# Patient Record
Sex: Female | Born: 1975 | Race: White | Hispanic: No | Smoking: Current every day smoker
Health system: Southern US, Community
[De-identification: ages and names within clinical notes are randomized; demographics above are authoritative.]

## PROBLEM LIST (undated history)

## (undated) HISTORY — PX: APPENDECTOMY: SHX54

---

## 2006-07-17 ENCOUNTER — Emergency Department (HOSPITAL_COMMUNITY): Admission: EM | Admit: 2006-07-17 | Discharge: 2006-07-17 | Payer: Self-pay | Admitting: Emergency Medicine

## 2008-10-17 ENCOUNTER — Emergency Department (HOSPITAL_COMMUNITY): Admission: EM | Admit: 2008-10-17 | Discharge: 2008-10-17 | Payer: Self-pay | Admitting: Emergency Medicine

## 2015-04-25 ENCOUNTER — Emergency Department (HOSPITAL_COMMUNITY)
Admission: EM | Admit: 2015-04-25 | Discharge: 2015-04-26 | Disposition: A | Discharge status: Home / Self Care | Payer: Self-pay | Attending: Emergency Medicine | Admitting: Emergency Medicine

## 2015-04-25 ENCOUNTER — Encounter (HOSPITAL_COMMUNITY): Payer: Self-pay

## 2015-04-25 DIAGNOSIS — N7091 Salpingitis, unspecified: Secondary | ICD-10-CM | POA: Insufficient documentation

## 2015-04-25 DIAGNOSIS — Z79899 Other long term (current) drug therapy: Secondary | ICD-10-CM | POA: Insufficient documentation

## 2015-04-25 DIAGNOSIS — Z881 Allergy status to other antibiotic agents status: Secondary | ICD-10-CM | POA: Insufficient documentation

## 2015-04-25 DIAGNOSIS — N939 Abnormal uterine and vaginal bleeding, unspecified: Secondary | ICD-10-CM | POA: Insufficient documentation

## 2015-04-25 DIAGNOSIS — R110 Nausea: Secondary | ICD-10-CM | POA: Insufficient documentation

## 2015-04-25 DIAGNOSIS — F17200 Nicotine dependence, unspecified, uncomplicated: Secondary | ICD-10-CM | POA: Insufficient documentation

## 2015-04-25 NOTE — ED Notes (Signed)
Pt states she has been having left lower abd pain x 2 days, states she has not had a bm in 2 days as well.  Pt denies vomiting or diarrhea but states she is nauseated.

## 2015-04-26 ENCOUNTER — Emergency Department (HOSPITAL_COMMUNITY): Payer: Self-pay

## 2015-04-26 LAB — CBC WITH DIFFERENTIAL/PLATELET
BASOS PCT: 0 %
Basophils Absolute: 0 10*3/uL (ref 0.0–0.1)
EOS PCT: 0 %
Eosinophils Absolute: 0 10*3/uL (ref 0.0–0.7)
HCT: 37.5 % (ref 36.0–46.0)
HEMOGLOBIN: 12.4 g/dL (ref 12.0–15.0)
Lymphocytes Relative: 7 %
Lymphs Abs: 1.4 10*3/uL (ref 0.7–4.0)
MCH: 26.7 pg (ref 26.0–34.0)
MCHC: 33.1 g/dL (ref 30.0–36.0)
MCV: 80.8 fL (ref 78.0–100.0)
MONO ABS: 1 10*3/uL (ref 0.1–1.0)
MONOS PCT: 5 %
NEUTROS ABS: 19 10*3/uL — AB (ref 1.7–7.7)
Neutrophils Relative %: 88 %
Platelets: 229 10*3/uL (ref 150–400)
RBC: 4.64 MIL/uL (ref 3.87–5.11)
RDW: 15.9 % — ABNORMAL HIGH (ref 11.5–15.5)
WBC: 21.4 10*3/uL — ABNORMAL HIGH (ref 4.0–10.5)

## 2015-04-26 LAB — COMPREHENSIVE METABOLIC PANEL
ALK PHOS: 122 U/L (ref 38–126)
ALT: 15 U/L (ref 14–54)
AST: 13 U/L — AB (ref 15–41)
Albumin: 3.6 g/dL (ref 3.5–5.0)
Anion gap: 9 (ref 5–15)
BILIRUBIN TOTAL: 1 mg/dL (ref 0.3–1.2)
BUN: 10 mg/dL (ref 6–20)
CO2: 25 mmol/L (ref 22–32)
CREATININE: 0.65 mg/dL (ref 0.44–1.00)
Calcium: 9 mg/dL (ref 8.9–10.3)
Chloride: 102 mmol/L (ref 101–111)
GFR calc Af Amer: >60 mL/min (ref >60)
Glucose, Bld: 140 mg/dL — ABNORMAL HIGH (ref 65–99)
Potassium: 3.3 mmol/L — ABNORMAL LOW (ref 3.5–5.1)
Sodium: 136 mmol/L (ref 135–145)
TOTAL PROTEIN: 8.3 g/dL — AB (ref 6.5–8.1)

## 2015-04-26 LAB — URINALYSIS, ROUTINE W REFLEX MICROSCOPIC
Glucose, UA: NEGATIVE mg/dL
Ketones, ur: 15 mg/dL — AB
Nitrite: NEGATIVE
PROTEIN: 100 mg/dL — AB
SPECIFIC GRAVITY, URINE: 1.02 (ref 1.005–1.030)
pH: 5.5 (ref 5.0–8.0)

## 2015-04-26 LAB — URINE MICROSCOPIC-ADD ON

## 2015-04-26 LAB — WET PREP, GENITAL
Sperm: NONE SEEN
Trich, Wet Prep: NONE SEEN
Yeast Wet Prep HPF POC: NONE SEEN

## 2015-04-26 LAB — PREGNANCY, URINE: PREG TEST UR: NEGATIVE

## 2015-04-26 MED ORDER — IOHEXOL 300 MG/ML  SOLN
100.0000 mL | Freq: Once | INTRAMUSCULAR | Status: AC | PRN
  Administered 2015-04-26: 100 mL via INTRAVENOUS

## 2015-04-26 MED ORDER — STERILE WATER FOR INJECTION IJ SOLN
INTRAMUSCULAR | Status: AC
  Filled 2015-04-26: qty 10

## 2015-04-26 MED ORDER — DOXYCYCLINE HYCLATE 100 MG PO CAPS: ORAL_CAPSULE | ORAL | Status: DC

## 2015-04-26 MED ORDER — ONDANSETRON HCL 4 MG/2ML IJ SOLN
4.0000 mg | Freq: Once | INTRAMUSCULAR | Status: AC
  Administered 2015-04-26: 4 mg via INTRAVENOUS
  Filled 2015-04-26: qty 2

## 2015-04-26 MED ORDER — DIPHENHYDRAMINE HCL 50 MG/ML IJ SOLN
50.0000 mg | Freq: Once | INTRAMUSCULAR | Status: AC
  Administered 2015-04-26: 50 mg via INTRAVENOUS
  Filled 2015-04-26: qty 1

## 2015-04-26 MED ORDER — DOXYCYCLINE HYCLATE 100 MG PO TABS
100.0000 mg | ORAL_TABLET | Freq: Once | ORAL | Status: AC
  Administered 2015-04-26: 100 mg via ORAL
  Filled 2015-04-26: qty 1

## 2015-04-26 MED ORDER — MORPHINE SULFATE (PF) 4 MG/ML IV SOLN
4.0000 mg | Freq: Once | INTRAVENOUS | Status: AC
  Administered 2015-04-26: 4 mg via INTRAVENOUS
  Filled 2015-04-26: qty 1

## 2015-04-26 MED ORDER — IOHEXOL 300 MG/ML  SOLN
50.0000 mL | Freq: Once | INTRAMUSCULAR | Status: AC | PRN
  Administered 2015-04-26: 50 mL via ORAL

## 2015-04-26 MED ORDER — CEFTRIAXONE SODIUM 250 MG IJ SOLR
250.0000 mg | Freq: Once | INTRAMUSCULAR | Status: AC
  Administered 2015-04-26: 250 mg via INTRAMUSCULAR
  Filled 2015-04-26: qty 250

## 2015-04-26 MED ORDER — METHYLPREDNISOLONE SODIUM SUCC 125 MG IJ SOLR
125.0000 mg | Freq: Once | INTRAMUSCULAR | Status: AC
  Administered 2015-04-26: 125 mg via INTRAVENOUS
  Filled 2015-04-26: qty 2

## 2015-04-26 MED ORDER — OXYCODONE-ACETAMINOPHEN 5-325 MG PO TABS: 1.0000 | ORAL_TABLET | Freq: Three times a day (TID) | ORAL | Status: DC | PRN

## 2015-04-26 MED ORDER — METRONIDAZOLE 500 MG PO TABS: ORAL_TABLET | ORAL | Status: DC

## 2015-04-26 NOTE — ED Provider Notes (Signed)
CSN: [id number]     Arrival date & time 04/25/15  2345 History   First MD Initiated Contact with Patient 04/25/15 2356     Chief Complaint  Patient presents with  . Abdominal Pain     (Consider location/radiation/quality/duration/timing/severity/associated sxs/prior Treatment) The history is provided by the patient.   Felicia Livingston is a 40 y.o. female presenting with sudden onset left lower quadrant abdominal pain starting 24 hours ago, described as constant, sharp and worsening.  She reports constipation with last bm occuring 3 days ago, she regularly has bid bm's.  States had a very stressful experience 2 days ago as she was a witness in a court case which she suspects is the source her pain and constipation.  She denies fevers or chills, but has had nausea without emesis.  Her pain is worsened with movement and palpation. She denies dysuria, hematuria, vaginal discharge and back pain.  She has found no alleviators, took ibuprofen without relief.  She reports having an asymptomatic left ovarian cyst several years ago. LMP 2/27.     History reviewed. No pertinent past medical history. Past Surgical History  Procedure Laterality Date  . Appendectomy     No family history on file. Social History  Substance Use Topics  . Smoking status: Current Every Day Smoker  . Smokeless tobacco: None  . Alcohol Use: No   OB History    No data available     Review of Systems  Constitutional: Negative for fever and chills.  HENT: Negative for congestion and sore throat.   Eyes: Negative.   Respiratory: Negative for chest tightness and shortness of breath.   Cardiovascular: Negative for chest pain.  Gastrointestinal: Positive for nausea, abdominal pain and constipation. Negative for vomiting.  Genitourinary: Positive for vaginal bleeding. Negative for dysuria and vaginal discharge.       Currently on menses  Musculoskeletal: Negative for joint swelling, arthralgias and neck pain.  Skin:  Negative.  Negative for rash and wound.  Neurological: Negative for dizziness, weakness, light-headedness, numbness and headaches.  Psychiatric/Behavioral: Negative.       Allergies  Lidocaine; Sulfa antibiotics; and Iohexol  Home Medications   Prior to Admission medications   Medication Sig Start Date End Date Taking? Authorizing Provider  ibuprofen (ADVIL,MOTRIN) 200 MG tablet Take 200 mg by mouth every 6 (six) hours as needed.   Yes Historical Provider, MD  doxycycline (VIBRAMYCIN) 100 MG capsule One po bid x 14 days 04/26/15   Zadie Rhine, MD  metroNIDAZOLE (FLAGYL) 500 MG tablet One po bid x 14 days  DO NOT DRINK ALCOHOL WITH THIS MEDICATION 04/26/15   Zadie Rhine, MD  oxyCODONE-acetaminophen (PERCOCET/ROXICET) 5-325 MG tablet Take 1 tablet by mouth every 8 (eight) hours as needed for severe pain. 04/26/15   Zadie Rhine, MD   BP 116/69 mmHg  Pulse 89  Temp(Src) 98.1 F (36.7 C)  Resp 18  Ht  (1.727 m)  Wt 90.719 kg  BMI 30.42 kg/m2  SpO2 96%  LMP 04/23/2015 Physical Exam  Constitutional: She appears well-developed and well-nourished. She appears distressed.  Appears uncomfortable  HENT:  Head: Normocephalic and atraumatic.  Eyes: Conjunctivae are normal.  Neck: Normal range of motion.  Cardiovascular: Regular rhythm, normal heart sounds and intact distal pulses.  Tachycardia present.   Pulmonary/Chest: Effort normal and breath sounds normal. She has no wheezes.  Abdominal: Soft. Bowel sounds are normal. There is tenderness in the left lower quadrant. There is guarding. There is no rigidity  and no CVA tenderness.  Referred pain to llq.  Genitourinary: Uterus is not enlarged and not tender. Cervix exhibits no motion tenderness. Right adnexum displays no mass, no tenderness and no fullness. Left adnexum displays no mass, no tenderness and no fullness. No tenderness in the vagina. No vaginal discharge found.  Trace blood in vagina.  Musculoskeletal: Normal  range of motion.  Neurological: She is alert.  Skin: Skin is warm and dry.  Psychiatric: She has a normal mood and affect.  Nursing note and vitals reviewed.   ED Course  Procedures (including critical care time) Labs Review Labs Reviewed  WET PREP, GENITAL - Abnormal; Notable for the following:    Clue Cells Wet Prep HPF POC PRESENT (*)    WBC, Wet Prep HPF POC FEW (*)    All other components within normal limits  CBC WITH DIFFERENTIAL/PLATELET - Abnormal; Notable for the following:    WBC 21.4 (*)    RDW 15.9 (*)    Neutro Abs 19.0 (*)    All other components within normal limits  COMPREHENSIVE METABOLIC PANEL - Abnormal; Notable for the following:    Potassium 3.3 (*)    Glucose, Bld 140 (*)    Total Protein 8.3 (*)    AST 13 (*)    All other components within normal limits  URINALYSIS, ROUTINE W REFLEX MICROSCOPIC (NOT AT [facility]) - Abnormal; Notable for the following:    Color, Urine AMBER (*)    APPearance HAZY (*)    Hgb urine dipstick LARGE (*)    Bilirubin Urine MODERATE (*)    Ketones, ur 15 (*)    Protein, ur 100 (*)    Leukocytes, UA TRACE (*)    All other components within normal limits  URINE MICROSCOPIC-ADD ON - Abnormal; Notable for the following:    Squamous Epithelial / LPF 6-30 (*)    Bacteria, UA MANY (*)    Casts RED CELL CAST (*)    All other components within normal limits  PREGNANCY, URINE  GC/CHLAMYDIA PROBE AMP ([place]) NOT AT [facility]    Imaging Review Ct Abdomen Pelvis W Contrast  04/26/2015  CLINICAL DATA:  41 year old female with lower abdominal pain x2 days. EXAM: CT ABDOMEN AND PELVIS WITH CONTRAST TECHNIQUE: Multidetector CT imaging of the abdomen and pelvis was performed using the standard protocol following bolus administration of intravenous contrast. CONTRAST:  50mL OMNIPAQUE IOHEXOL 300 MG/ML SOLN, OMNIPAQUE IOHEXOL 300 MG/ML SOLN COMPARISON:  None. FINDINGS: The visualized lung bases are clear. No intra-abdominal free air. Trace  free fluid within the pelvis. The liver, gallbladder, pancreas, spleen, adrenal glands, kidneys, visualized ureters, and urinary bladder appear unremarkable. The uterus is anteverted and grossly unremarkable. Dilated and fluid-filled tubular structure in the region of the left adnexal most compatible with hydrosalpinx. There is mild haziness of the walls of the dilated fallopian tubes with mild haziness of the surrounding fat concerning for superimposed infection. Clinical correlation is recommended. The right ovary is grossly unremarkable. There is moderate stool throughout the colon. No evidence of bowel obstruction or inflammation. Appendectomy. The abdominal aorta and IVC appear unremarkable. No portal venous gas identified. There is no adenopathy. The abdominal wall soft tissues appear unremarkable. The osseous structures are intact. IMPRESSION: Dilated fluid-filled left fallopian tube with possible superimposed infection may represent salpingitis. Correlation with clinical exam is recommended to evaluate for pelvic inflammatory disease. No other acute intra-abdominal or pelvic pathology identified. Electronically Signed   By: Ceasar Mons.D.  On: 04/26/2015 03:20   I have personally reviewed and evaluated these images and lab results as part of my medical decision-making.   EKG Interpretation None      MDM   Final diagnoses:  Salpingitis    Pt with llq pain, no adnexal tenderness. Pt wit perceived constipation and LLQ pain, wbc count of 21.4.  CT scan pending.  Dr. Bebe Shaggy to dispo patient once CT results.    Burgess Amor, PA-C 04/26/15 1206  Zadie Rhine, MD 04/27/15 [phone]

## 2015-04-26 NOTE — ED Provider Notes (Signed)
I assumed care at signout to f/u on CT imaging Pt required pre-treatment with benadryl/solumedrol due to previous itching after IV contrast No complications with IV contrast tonight CT reveals probable salpingitis Pt reports distant h/o STD but none recently Given CT findings, will treat as PID for 2 weeks Advised to avoid all sexual activity Advised GC/Chlamydia results still pending but will empirically treat I Discussed this patient in private Pt appears improved She is otherwise stable for d/c home   Zadie Rhine, MD 04/26/15 [phone]

## 2015-04-26 NOTE — Discharge Instructions (Signed)
°  HOME CARE  Take over-the-counter and prescription medicines only as told by your doctor.  If you were prescribed an antibiotic medicine, take it as told by your doctor. Do not stop taking it even if you start to feel better.  Do not have sex until treatment is done or as told by your doctor.  Keep all follow-up visits as told by your doctor. This is important.  Your doctor may test you for infection again 3 months after you are treated. GET HELP IF:  You have more fluid (discharge) coming from your vagina or fluid that is not normal.  Your pain does not improve.  You throw up (vomit).  You have a fever.  You cannot take your medicines.  Your partner has a sexually transmitted disease (STD).  You have pain when you pee (urinate). GET HELP RIGHT AWAY IF:  You have more belly (abdominal) or lower belly pain.  You have chills.  You are not better after 72 hours.   This information is not intended to replace advice given to you by your health care provider. Make sure you discuss any questions you have with your health care provider.   Document Released: 05/09/2008 Document Revised: 11/01/2014 Document Reviewed: 03/20/2014 Elsevier Interactive Patient Education [employer]

## 2015-04-27 LAB — GC/CHLAMYDIA PROBE AMP (~~LOC~~) NOT AT ARMC
Chlamydia: NEGATIVE
Neisseria Gonorrhea: NEGATIVE

## 2015-09-28 ENCOUNTER — Emergency Department (HOSPITAL_COMMUNITY)
Admission: EM | Admit: 2015-09-28 | Discharge: 2015-09-28 | Disposition: A | Discharge status: Home / Self Care | Payer: Self-pay | Attending: Emergency Medicine | Admitting: Emergency Medicine

## 2015-09-28 ENCOUNTER — Encounter (HOSPITAL_COMMUNITY): Payer: Self-pay | Admitting: Emergency Medicine

## 2015-09-28 DIAGNOSIS — L02411 Cutaneous abscess of right axilla: Secondary | ICD-10-CM | POA: Insufficient documentation

## 2015-09-28 DIAGNOSIS — F17200 Nicotine dependence, unspecified, uncomplicated: Secondary | ICD-10-CM | POA: Insufficient documentation

## 2015-09-28 LAB — CBC WITH DIFFERENTIAL/PLATELET
Basophils Absolute: 0 10*3/uL (ref 0.0–0.1)
Basophils Relative: 0 %
EOS ABS: 0.1 10*3/uL (ref 0.0–0.7)
EOS PCT: 0 %
HCT: 40.1 % (ref 36.0–46.0)
Hemoglobin: 12.7 g/dL (ref 12.0–15.0)
LYMPHS ABS: 1.4 10*3/uL (ref 0.7–4.0)
Lymphocytes Relative: 11 %
MCH: 26.1 pg (ref 26.0–34.0)
MCHC: 31.7 g/dL (ref 30.0–36.0)
MCV: 82.5 fL (ref 78.0–100.0)
MONOS PCT: 5 %
Monocytes Absolute: 0.6 10*3/uL (ref 0.1–1.0)
Neutro Abs: 11 10*3/uL — ABNORMAL HIGH (ref 1.7–7.7)
Neutrophils Relative %: 84 %
PLATELETS: 255 10*3/uL (ref 150–400)
RBC: 4.86 MIL/uL (ref 3.87–5.11)
RDW: 15.8 % — ABNORMAL HIGH (ref 11.5–15.5)
WBC: 13.1 10*3/uL — AB (ref 4.0–10.5)

## 2015-09-28 LAB — BASIC METABOLIC PANEL
Anion gap: 9 (ref 5–15)
BUN: 10 mg/dL (ref 6–20)
CALCIUM: 9 mg/dL (ref 8.9–10.3)
CO2: 24 mmol/L (ref 22–32)
CREATININE: 0.6 mg/dL (ref 0.44–1.00)
Chloride: 100 mmol/L — ABNORMAL LOW (ref 101–111)
GFR calc Af Amer: >60 mL/min (ref >60)
Glucose, Bld: 135 mg/dL — ABNORMAL HIGH (ref 65–99)
Potassium: 3.8 mmol/L (ref 3.5–5.1)
SODIUM: 133 mmol/L — AB (ref 135–145)

## 2015-09-28 MED ORDER — HYDROCODONE-ACETAMINOPHEN 5-325 MG PO TABS: 1.0000 | ORAL_TABLET | ORAL | 0 refills | Status: DC | PRN

## 2015-09-28 MED ORDER — DOXYCYCLINE HYCLATE 100 MG PO CAPS: 100.0000 mg | ORAL_CAPSULE | Freq: Two times a day (BID) | ORAL | 0 refills | Status: DC

## 2015-09-28 MED ORDER — DOXYCYCLINE HYCLATE 100 MG PO TABS
100.0000 mg | ORAL_TABLET | Freq: Once | ORAL | Status: AC
  Administered 2015-09-28: 100 mg via ORAL
  Filled 2015-09-28: qty 1

## 2015-09-28 MED ORDER — OXYCODONE-ACETAMINOPHEN 5-325 MG PO TABS
1.0000 | ORAL_TABLET | Freq: Once | ORAL | Status: AC
  Administered 2015-09-28: 1 via ORAL
  Filled 2015-09-28: qty 1

## 2015-09-28 NOTE — ED Provider Notes (Signed)
[facility]-EMERGENCY DEPT Provider Note   CSN: [id number] Arrival date & time: 09/28/15  1453  First Provider Contact:  First MD Initiated Contact with Patient 09/28/15 1909        History   Chief Complaint Chief Complaint  Patient presents with  . Abscess    HPI Felicia Livingston is a 40 y.o. female who presents to the ED with an abscess to the right axilla that started 4 days ago. She reports that she has them often but they usually drain on their own after she applied warm compresses. This one has gotten larger but will not drain.   The history is provided by the patient. No language interpreter was used.  Abscess  Location:  Shoulder/arm Shoulder/arm abscess location:  R axilla Associated symptoms: no fever, no nausea and no vomiting     History reviewed. No pertinent past medical history.  There are no active problems to display for this patient.   Past Surgical History:  Procedure Laterality Date  . APPENDECTOMY      OB History    No data available       Home Medications    Prior to Admission medications   Medication Sig Start Date End Date Taking? Authorizing Provider  doxycycline (VIBRAMYCIN) 100 MG capsule Take 1 capsule (100 mg total) by mouth 2 (two) times daily. 09/28/15   Shaquinta Peruski Orlene Och, NP  HYDROcodone-acetaminophen (NORCO/VICODIN) 5-325 MG tablet Take 1 tablet by mouth every 4 (four) hours as needed. 09/28/15   Starr Engel Orlene Och, NP  ibuprofen (ADVIL,MOTRIN) 200 MG tablet Take 200 mg by mouth every 6 (six) hours as needed.    Historical Provider, MD  metroNIDAZOLE (FLAGYL) 500 MG tablet One po bid x 14 days  DO NOT DRINK ALCOHOL WITH THIS MEDICATION 04/26/15   Zadie Rhine, MD  oxyCODONE-acetaminophen (PERCOCET/ROXICET) 5-325 MG tablet Take 1 tablet by mouth every 8 (eight) hours as needed for severe pain. 04/26/15   Zadie Rhine, MD    Family History History reviewed. No pertinent family history.  Social History Social History  Substance Use Topics  .  Smoking status: Current Every Day Smoker  . Smokeless tobacco: Not on file  . Alcohol use No     Allergies   Lidocaine; Sulfa antibiotics; and Iohexol   Review of Systems Review of Systems  Constitutional: Negative for fever.  Gastrointestinal: Negative for nausea and vomiting.  Skin: Positive for wound.  all other systems negataive   Physical Exam Updated Vital Signs BP 129/88 (BP Location: Left Arm)   Pulse 98   Temp 98.6 F (37 C) (Oral)   Resp 18   Ht 5\' 9"  (1.753 m)   Wt 90.7 kg   LMP 09/13/2015   SpO2 98%   BMI 29.53 kg/m   Physical Exam  Constitutional: She is oriented to person, place, and time. She appears well-developed and well-nourished. No distress.  HENT:  Head: Normocephalic and atraumatic.  Eyes: EOM are normal.  Neck: Neck supple.  Cardiovascular: Normal rate.   Pulmonary/Chest: Effort normal.  Abdominal: Soft. There is no tenderness.  Musculoskeletal: Normal range of motion. Tenderness: right axilla.  Neurological: She is alert and oriented to person, place, and time. No cranial nerve deficit.  Skin:  Right axilla with large raised tender area with erythema. The area is fluctuant.  Nursing note and vitals reviewed.    ED Treatments / Results  Labs (all labs ordered are listed, but only abnormal results are displayed) Labs Reviewed  CBC WITH  DIFFERENTIAL/PLATELET - Abnormal; Notable for the following:       Result Value   WBC 13.1 (*)    RDW 15.8 (*)    Neutro Abs 11.0 (*)    All other components within normal limits  BASIC METABOLIC PANEL - Abnormal; Notable for the following:    Sodium 133 (*)    Chloride 100 (*)    Glucose, Bld 135 (*)    All other components within normal limits     Radiology No results found.  Procedures .Marland KitchenIncision and Drainage Date/Time: 09/28/2015 7:15 PM Performed by: Janne Napoleon Authorized by: Janne Napoleon   Consent:    Consent obtained:  Verbal   Consent given by:  Patient   Risks discussed:   Incomplete drainage and pain   Alternatives discussed:  No treatment Location:    Type:  Abscess (right axilla)   Size:  5 cm   Location:  Upper extremity   Upper extremity location:  Arm   Arm location: right axilla. Pre-procedure details:    Skin preparation:  Betadine Anesthesia (see MAR for exact dosages):    Anesthesia method:  None (patient is allergic to lidocaine) Procedure type:    Complexity:  Complex Procedure details:    Incision types:  Single straight   Incision depth:  Subcutaneous   Scalpel blade:  11   Drainage:  Purulent   Drainage amount:  Copious   Wound treatment:  Wound left open   Packing materials:  None Post-procedure details:    Patient tolerance of procedure:  Tolerated well, no immediate complications Comments:     Unable to use lidocaine due to patient allergy. Patient agreed to procedure without anesthesia. She tolerated the procedure and was given Percocet for pain while in the department.  Patient was unable to tolerate probing the wound. She is up to date on tetanus.    (including critical care time)  Medications Ordered in ED Medications  doxycycline (VIBRA-TABS) tablet 100 mg (100 mg Oral Given 09/28/15 1934)  oxyCODONE-acetaminophen (PERCOCET/ROXICET) 5-325 MG per tablet 1 tablet (1 tablet Oral Given 09/28/15 1934)     Initial Impression / Assessment and Plan / ED Course  I have reviewed the triage vital signs and the nursing notes.  Pertinent lab results that were available during my care of the patient were reviewed by me and considered in my medical decision making (see chart for details).  Clinical Course  Discussed with the patient and all questioned fully answered. She will return if any problems arise.    Final Clinical Impressions(s) / ED Diagnoses  40 y.o. female with large abscess to the right axilla stable for d/c without fever red streaking and does not appear toxic. Will start antibiotics and she will apply warm wet  compresses to the area. She will f/u with her PCP or return here for worsening symptoms.   Final diagnoses:  Abscess of right axilla    New Prescriptions Discharge Medication List as of 09/28/2015  7:33 PM    START taking these medications   Details  HYDROcodone-acetaminophen (NORCO/VICODIN) 5-325 MG tablet Take 1 tablet by mouth every 4 (four) hours as needed., Starting Fri 09/28/2015, Print         [place], NP 09/28/15 2234    Maia Plan, MD 09/29/15 2217

## 2015-09-28 NOTE — ED Notes (Signed)
Pt waiting in lobby, respirations are even and unlabored, NAD noted. 

## 2015-09-28 NOTE — ED Triage Notes (Signed)
Pt states she has an abscess under right axilla.

## 2015-09-28 NOTE — ED Notes (Signed)
Pt alert & oriented x4, stable gait. Patient given discharge instructions, paperwork & prescription(s). Patient  instructed to stop at the registration desk to finish any additional paperwork. Patient verbalized understanding. Pt left department w/ no further questions. 

## 2017-03-21 IMAGING — CT CT ABD-PELV W/ CM
2 of 5 series · 16 of 46 positions shown, 18 images · IV contrast (Omnipaque 300)
Comparison: None.

CLINICAL DATA: 39-year-old female with lower abdominal pain x2
days.

EXAM:
CT ABDOMEN AND PELVIS WITH CONTRAST
TECHNIQUE: Multidetector CT imaging of the abdomen and pelvis was performed
using the standard protocol following bolus administration of
intravenous contrast.
CONTRAST:  50mL OMNIPAQUE IOHEXOL 300 MG/ML SOLN, 100mL OMNIPAQUE
IOHEXOL 300 MG/ML SOLN

[Series 2: abd_pel_with 5.0 b40f · axial · 0.79mm/px · z∈[-492,-47]mm · 13 of 101 slices shown, 15 images]
[im 6/101  soft-tissue]
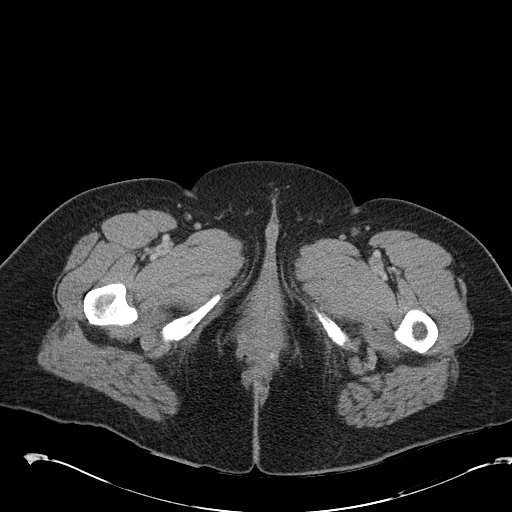
[im 6/101  bone]
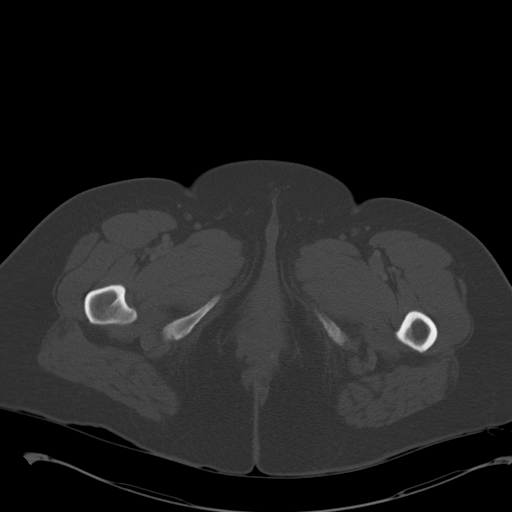
[im 12/101  soft-tissue]
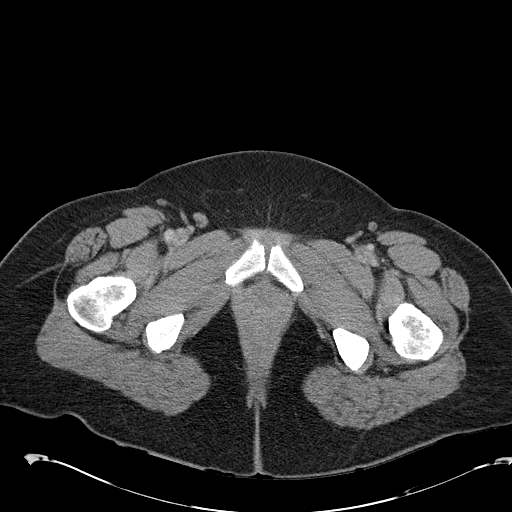
[im 24/101  soft-tissue]
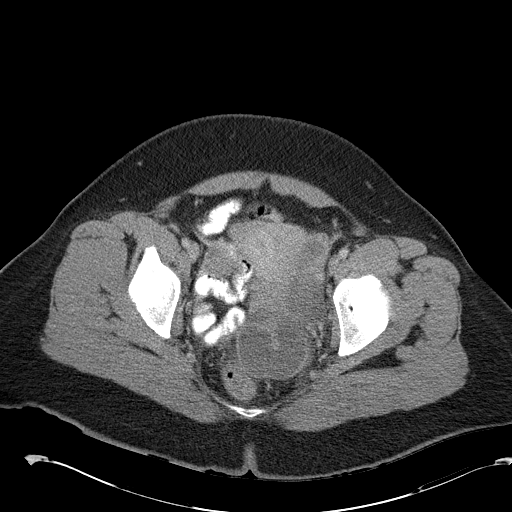
[im 30/101  soft-tissue]
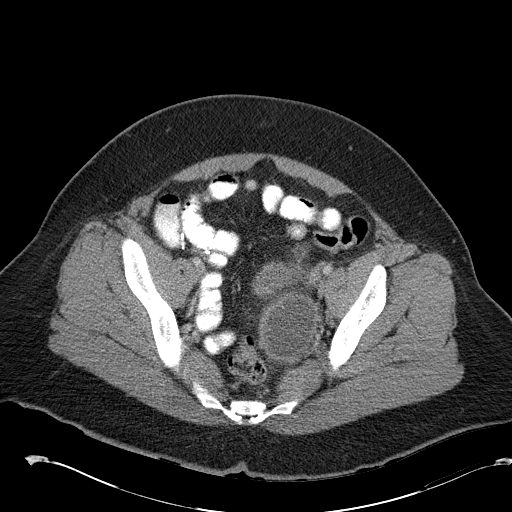
[im 36/101  soft-tissue]
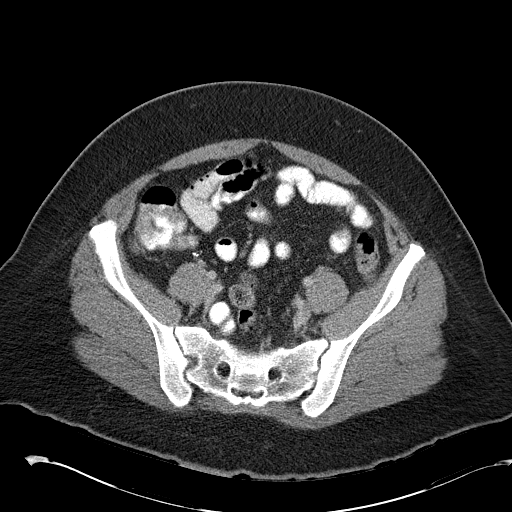
[im 42/101  soft-tissue]
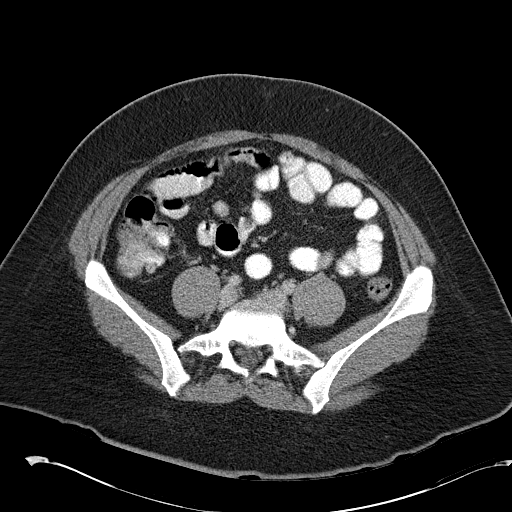
[im 53/101  soft-tissue]
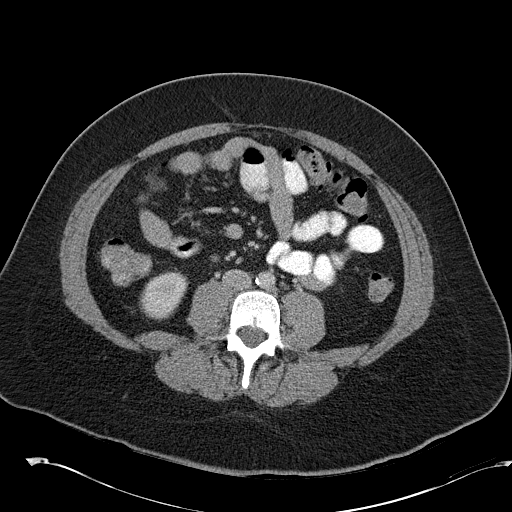
[im 59/101  soft-tissue]
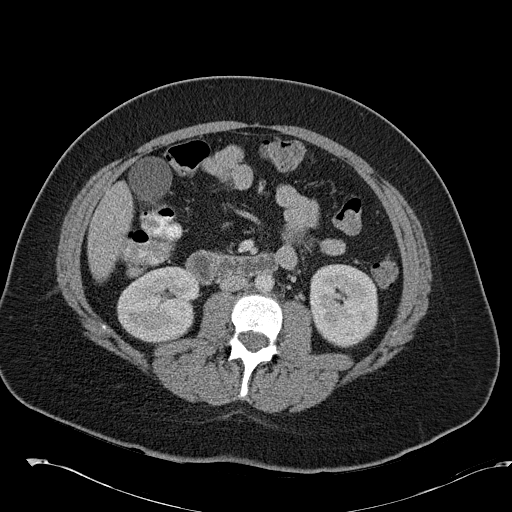
[im 65/101  soft-tissue]
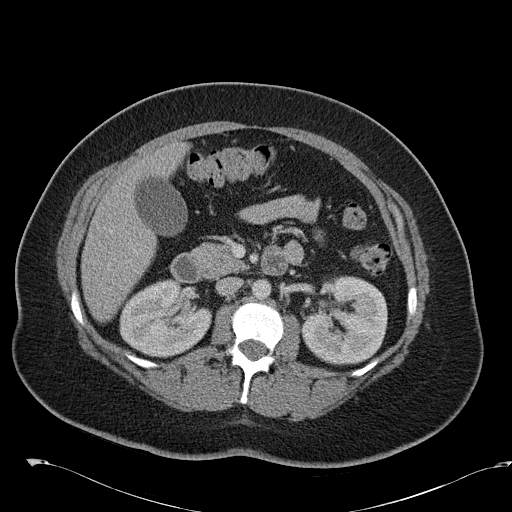
[im 65/101  bone]
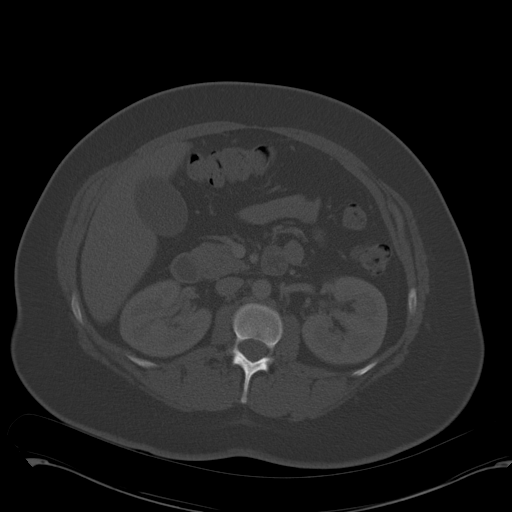
[im 71/101  soft-tissue]
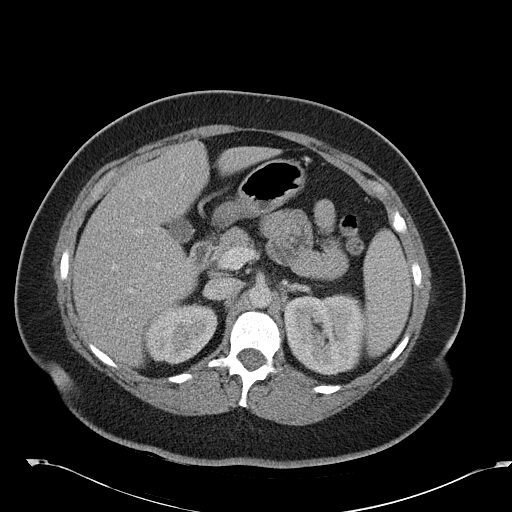
[im 77/101  soft-tissue]
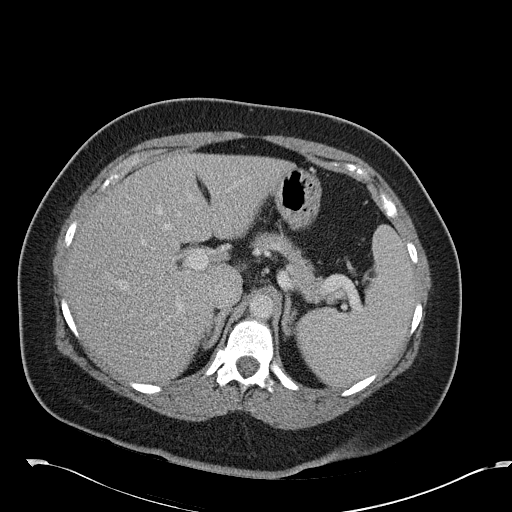
[im 89/101  soft-tissue]
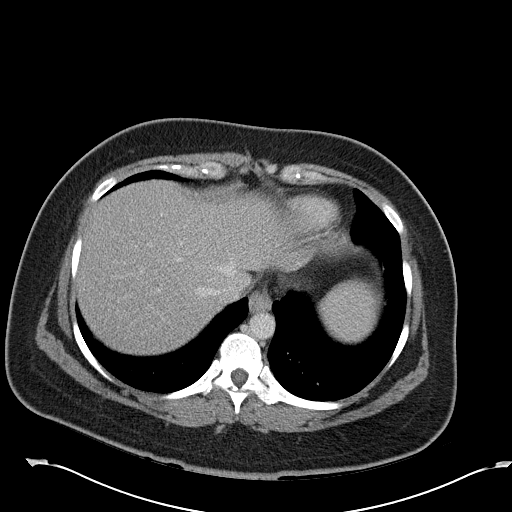
[im 95/101  soft-tissue]
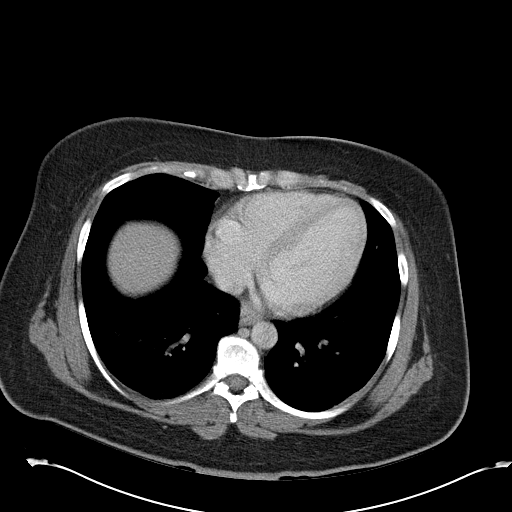

[Series 3: abd_pel_with 3.0 spo cor · coronal · 0.85mm/px · 3 of 96 slices shown]
[im 32/96  soft-tissue]
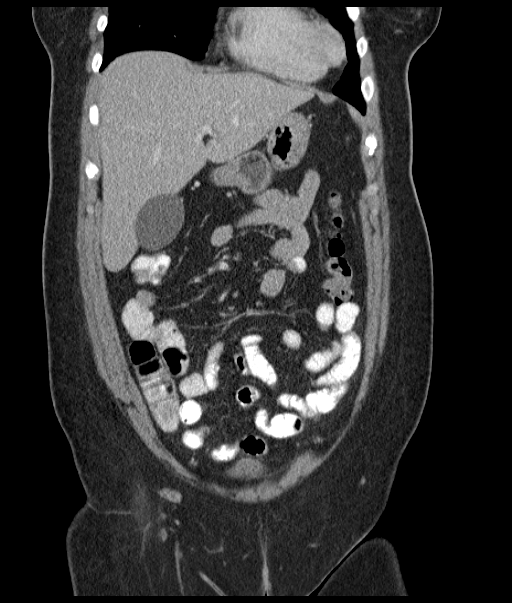
[im 43/96  soft-tissue]
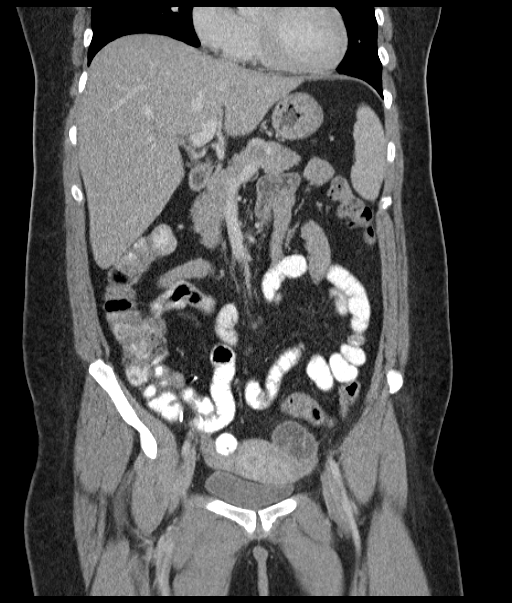
[im 53/96  soft-tissue]
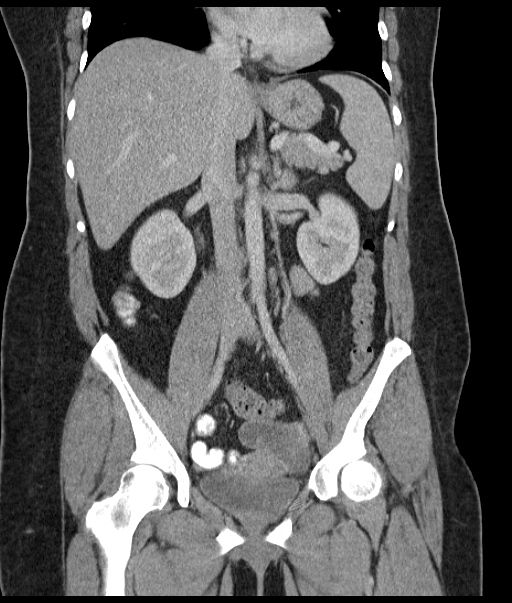

[16 of 46 positions shown; findings below may reference images not displayed]

FINDINGS: The visualized lung bases are clear. No intra-abdominal free air.
Trace free fluid within the pelvis.

The liver, gallbladder, pancreas, spleen, adrenal glands, kidneys,
visualized ureters, and urinary bladder appear unremarkable. The
uterus is anteverted and grossly unremarkable. Dilated and
fluid-filled tubular structure in the region of the left adnexal
most compatible with hydrosalpinx. There is mild haziness of the
walls of the dilated fallopian tubes with mild haziness of the
surrounding fat concerning for superimposed infection. Clinical
correlation is recommended. The right ovary is grossly unremarkable.

There is moderate stool throughout the colon. No evidence of bowel
obstruction or inflammation. Appendectomy.

The abdominal aorta and IVC appear unremarkable. No portal venous
gas identified. There is no adenopathy. The abdominal wall soft
tissues appear unremarkable. The osseous structures are intact.
IMPRESSION: Dilated fluid-filled left fallopian tube with possible superimposed
infection may represent salpingitis. Correlation with clinical exam
is recommended to evaluate for pelvic inflammatory disease. No other
acute intra-abdominal or pelvic pathology identified.

## 2019-03-07 ENCOUNTER — Other Ambulatory Visit: Payer: Self-pay

## 2019-03-07 ENCOUNTER — Ambulatory Visit
Admission: EM | Admit: 2019-03-07 | Discharge: 2019-03-07 | Disposition: A | Discharge status: Home / Self Care | Payer: Self-pay | Attending: Emergency Medicine | Admitting: Emergency Medicine

## 2019-03-07 DIAGNOSIS — N3001 Acute cystitis with hematuria: Secondary | ICD-10-CM

## 2019-03-07 LAB — POCT URINALYSIS DIP (MANUAL ENTRY)
Bilirubin, UA: NEGATIVE
Glucose, UA: 250 mg/dL — AB
Nitrite, UA: POSITIVE — AB
Protein Ur, POC: >=300 mg/dL — AB
Spec Grav, UA: 1.01 (ref 1.010–1.025)
Urobilinogen, UA: 2 E.U./dL — AB
pH, UA: 5 (ref 5.0–8.0)

## 2019-03-07 LAB — POCT URINE PREGNANCY: Preg Test, Ur: NEGATIVE

## 2019-03-07 MED ORDER — PHENAZOPYRIDINE HCL 100 MG PO TABS: 100.0000 mg | ORAL_TABLET | Freq: Three times a day (TID) | ORAL | 0 refills | Status: DC | PRN

## 2019-03-07 MED ORDER — CEPHALEXIN 250 MG PO CAPS: 250.0000 mg | ORAL_CAPSULE | Freq: Four times a day (QID) | ORAL | 0 refills | Status: DC

## 2019-03-07 MED ORDER — CEPHALEXIN 500 MG PO CAPS: 500.0000 mg | ORAL_CAPSULE | Freq: Two times a day (BID) | ORAL | 0 refills | Status: AC

## 2019-03-07 NOTE — ED Triage Notes (Signed)
Pt presents with c/o dysuria  that began a few days ago  

## 2019-03-07 NOTE — ED Provider Notes (Addendum)
[facility]    CSN: [id number] Arrival date & time: 03/07/19  1512      History   Chief Complaint Chief Complaint  Patient presents with  . Dysuria    HPI Felicia Livingston is a 44 y.o. female.   Felicia Livingston 44 years old female presented to the urgent care for complaint of dysuria for the past 4 days.  She denies a precipitating event or recent sexual encounter.  She has tried Azo without relief.  Her symptoms are made worse with urination.  She reports similar symptoms in the past that improved with antibiotic treatment.  She complains of decreased amount, increased urgency.  She denies fever, chills, nausea, vomiting, abdominal pain, flank pain, hematuria, or incontinence   Dysuria   History reviewed. No pertinent past medical history.  There are no problems to display for this patient.   Past Surgical History:  Procedure Laterality Date  . APPENDECTOMY      OB History   No obstetric history on file.      Home Medications    Prior to Admission medications   Medication Sig Start Date End Date Taking? Authorizing Provider  cephALEXin (KEFLEX) 500 MG capsule Take 1 capsule (500 mg total) by mouth 2 (two) times daily for 7 days. 03/07/19 03/14/19  Ethelean Colla, Darrelyn Hillock, FNP  doxycycline (VIBRAMYCIN) 100 MG capsule Take 1 capsule (100 mg total) by mouth 2 (two) times daily. 09/28/15   Ashley Murrain, NP  HYDROcodone-acetaminophen (NORCO/VICODIN) 5-325 MG tablet Take 1 tablet by mouth every 4 (four) hours as needed. 09/28/15   Ashley Murrain, NP  ibuprofen (ADVIL,MOTRIN) 200 MG tablet Take 200 mg by mouth every 6 (six) hours as needed.    [provider]  metroNIDAZOLE (FLAGYL) 500 MG tablet One po bid x 14 days  DO NOT DRINK ALCOHOL WITH THIS MEDICATION 04/26/15   Ripley Fraise, MD  oxyCODONE-acetaminophen (PERCOCET/ROXICET) 5-325 MG tablet Take 1 tablet by mouth every 8 (eight) hours as needed for severe pain. 04/26/15   Ripley Fraise, MD  phenazopyridine  (PYRIDIUM) 100 MG tablet Take 1 tablet (100 mg total) by mouth 3 (three) times daily as needed for pain. 03/07/19   Carmichael Burdette, Darrelyn Hillock, FNP    Family History History reviewed. No pertinent family history.  Social History Social History   Tobacco Use  . Smoking status: Current Every Day Smoker  Substance Use Topics  . Alcohol use: No  . Drug use: No     Allergies   Lidocaine, Sulfa antibiotics, and Iohexol   Review of Systems Review of Systems  Constitutional: Negative.   Respiratory: Negative.   Cardiovascular: Negative.   Gastrointestinal: Negative.   Genitourinary: Positive for dysuria.     Physical Exam Triage Vital Signs ED Triage Vitals  Enc Vitals Group     BP 03/07/19 1517 (!) 157/103     Pulse Rate 03/07/19 1517 (!) 109     Resp 03/07/19 1517 16     Temp 03/07/19 1517 98.3 F (36.8 C)     Temp Source 03/07/19 1517 Oral     SpO2 03/07/19 1517 97 %     Weight --      Height --      Head Circumference --      Peak Flow --      Pain Score 03/07/19 1523 5     Pain Loc --      Pain Edu? --      Excl. in [place]? --  No data found.  Updated Vital Signs BP (!) 157/103 (BP Location: Right Arm)   Pulse (!) 109   Temp 98.3 F (36.8 C) (Oral)   Resp 16   LMP 02/21/2019   SpO2 97%   Visual Acuity Right Eye Distance:   Left Eye Distance:   Bilateral Distance:    Right Eye Near:   Left Eye Near:    Bilateral Near:     Physical Exam Constitutional:      General: She is not in acute distress.    Appearance: Normal appearance. She is obese. She is not ill-appearing or toxic-appearing.  Cardiovascular:     Rate and Rhythm: Regular rhythm. Tachycardia present.     Pulses: Normal pulses.     Heart sounds: Normal heart sounds. No murmur. No gallop.   Pulmonary:     Effort: No respiratory distress.     Breath sounds: No wheezing.  Chest:     Chest wall: No tenderness.  Abdominal:     General: Abdomen is flat. Bowel sounds are normal. There is no  distension.     Palpations: Abdomen is soft. There is no mass.     Tenderness: There is no abdominal tenderness. There is no left CVA tenderness, guarding or rebound.     Hernia: No hernia is present.  Neurological:     Mental Status: She is alert.      UC Treatments / Results  Labs (all labs ordered are listed, but only abnormal results are displayed) Labs Reviewed  POCT URINALYSIS DIP (MANUAL ENTRY) - Abnormal; Notable for the following components:      Result Value   Color, UA orange (*)    Clarity, UA cloudy (*)    Glucose, UA =250 (*)    Ketones, POC UA trace (5) (*)    Blood, UA moderate (*)    Protein Ur, POC >=300 (*)    Urobilinogen, UA 2.0 (*)    Nitrite, UA Positive (*)    Leukocytes, UA Large (3+) (*)    All other components within normal limits  URINE CULTURE  POCT URINE PREGNANCY    EKG   Radiology No results found.  Procedures Procedures (including critical care time)  Medications Ordered in UC Medications - No data to display  Initial Impression / Assessment and Plan / UC Course  I have reviewed the triage vital signs and the nursing notes.  Pertinent labs & imaging results that were available during my care of the patient were reviewed by me and considered in my medical decision making (see chart for details).   Point-of-care pregnancy test was ordered and result was reviewed.  Result was negative for pregnancy. Point-of-care urine analysis test was ordered and result was reviewed.  Results show cloudy urine with moderate red blood cell and positive nitrite.  Patient will be treated for UTI.  Advised patient to take antibiotic as prescribed and to completion.  To return for worsening of symptoms.  Patient verbalized understanding the plan of care.   Final Clinical Impressions(s) / UC Diagnoses   Final diagnoses:  Acute cystitis with hematuria     Discharge Instructions     Urine culture sent.  We will call you with the results.   Push  fluids and get plenty of rest.   Take antibiotic as directed and to completion Take pyridium as prescribed and as needed for symptomatic relief Follow up with PCP if symptoms persists Return here or go to ER if you have any  new or worsening symptoms such as fever, worsening abdominal pain, nausea/vomiting, flank pain     ED Prescriptions    Medication Sig Dispense Auth. Provider   cephALEXin (KEFLEX) 250 MG capsule  (Status: Discontinued) Take 1 capsule (250 mg total) by mouth 4 (four) times daily. 28 capsule Jahliyah Trice, Zachery Dakins, FNP   phenazopyridine (PYRIDIUM) 100 MG tablet Take 1 tablet (100 mg total) by mouth 3 (three) times daily as needed for pain. 10 tablet Isael Stille, Zachery Dakins, FNP   cephALEXin (KEFLEX) 500 MG capsule Take 1 capsule (500 mg total) by mouth 2 (two) times daily for 7 days. 14 capsule Omnia Dollinger, Zachery Dakins, FNP     PDMP not reviewed this encounter.   Durward Parcel, FNP 03/07/19 1542    Durward Parcel, FNP 03/07/19 1547

## 2019-03-07 NOTE — Discharge Instructions (Signed)
Urine culture sent.  We will call you with the results.   °Push fluids and get plenty of rest.   °Take antibiotic as directed and to completion °Take pyridium as prescribed and as needed for symptomatic relief °Follow up with PCP if symptoms persists °Return here or go to ER if you have any new or worsening symptoms such as fever, worsening abdominal pain, nausea/vomiting, flank pain  °

## 2019-03-09 LAB — URINE CULTURE: Culture: >=100000 — AB

## 2019-05-15 ENCOUNTER — Other Ambulatory Visit: Payer: Self-pay

## 2019-05-15 ENCOUNTER — Emergency Department (HOSPITAL_COMMUNITY)
Admission: EM | Admit: 2019-05-15 | Discharge: 2019-05-15 | Disposition: A | Discharge status: Home / Self Care | Payer: Self-pay | Attending: Emergency Medicine | Admitting: Emergency Medicine

## 2019-05-15 ENCOUNTER — Encounter (HOSPITAL_COMMUNITY): Payer: Self-pay | Admitting: Emergency Medicine

## 2019-05-15 DIAGNOSIS — K047 Periapical abscess without sinus: Secondary | ICD-10-CM | POA: Insufficient documentation

## 2019-05-15 DIAGNOSIS — F17200 Nicotine dependence, unspecified, uncomplicated: Secondary | ICD-10-CM | POA: Insufficient documentation

## 2019-05-15 DIAGNOSIS — R220 Localized swelling, mass and lump, head: Secondary | ICD-10-CM | POA: Insufficient documentation

## 2019-05-15 MED ORDER — FLUCONAZOLE 150 MG PO TABS: 150.0000 mg | ORAL_TABLET | Freq: Once | ORAL | 0 refills | Status: AC

## 2019-05-15 MED ORDER — CLINDAMYCIN HCL 150 MG PO CAPS: 300.0000 mg | ORAL_CAPSULE | Freq: Four times a day (QID) | ORAL | 0 refills | Status: AC

## 2019-05-15 MED ORDER — CLINDAMYCIN PHOSPHATE 600 MG/50ML IV SOLN
600.0000 mg | Freq: Once | INTRAVENOUS | Status: AC
  Administered 2019-05-15: 600 mg via INTRAVENOUS
  Filled 2019-05-15: qty 50

## 2019-05-15 MED ORDER — IBUPROFEN 800 MG PO TABS
800.0000 mg | ORAL_TABLET | Freq: Once | ORAL | Status: AC
  Administered 2019-05-15: 800 mg via ORAL
  Filled 2019-05-15: qty 1

## 2019-05-15 NOTE — ED Provider Notes (Signed)
[facility] EMERGENCY DEPARTMENT Provider Note   CSN: [id number] Arrival date & time: 05/15/19  1942     History No chief complaint on file.   Felicia Livingston is a 44 y.o. female with a known history of generalized dental decay presenting with a several day history of tenderness along her left upper dentition which has progressed to include swelling of her face along with erythema which started today.  She denies fevers or chills or difficulty swallowing although chewing currently increases pain.  She has taken ibuprofen 600 mg which does significantly improve her pain symptoms.  She had arranged for dental implants through [facility] last year before the onset of the COVID-19 pandemic.  This treatment to be postponed, but she anticipates starting this process next month.  The history is provided by the patient.       History reviewed. No pertinent past medical history.  There are no problems to display for this patient.   Past Surgical History:  Procedure Laterality Date  . APPENDECTOMY       OB History   No obstetric history on file.     History reviewed. No pertinent family history.  Social History   Tobacco Use  . Smoking status: Current Every Day Smoker  Substance Use Topics  . Alcohol use: No  . Drug use: No    Home Medications Prior to Admission medications   Medication Sig Start Date End Date Taking? Authorizing Provider  clindamycin (CLEOCIN) 150 MG capsule Take 2 capsules (300 mg total) by mouth every 6 (six) hours for 7 days. 05/15/19 05/22/19  Evalee Jefferson, PA-C  doxycycline (VIBRAMYCIN) 100 MG capsule Take 1 capsule (100 mg total) by mouth 2 (two) times daily. 09/28/15   Ashley Murrain, NP  fluconazole (DIFLUCAN) 150 MG tablet Take 1 tablet (150 mg total) by mouth once for 1 dose. Take 1 tablet once, if needed for vaginitis 05/15/19 05/15/19  Evalee Jefferson, PA-C  HYDROcodone-acetaminophen (NORCO/VICODIN) 5-325 MG tablet Take 1 tablet by mouth every 4 (four) hours  as needed. 09/28/15   Ashley Murrain, NP  ibuprofen (ADVIL,MOTRIN) 200 MG tablet Take 200 mg by mouth every 6 (six) hours as needed.    [provider]  metroNIDAZOLE (FLAGYL) 500 MG tablet One po bid x 14 days  DO NOT DRINK ALCOHOL WITH THIS MEDICATION 04/26/15   Ripley Fraise, MD  oxyCODONE-acetaminophen (PERCOCET/ROXICET) 5-325 MG tablet Take 1 tablet by mouth every 8 (eight) hours as needed for severe pain. 04/26/15   Ripley Fraise, MD  phenazopyridine (PYRIDIUM) 100 MG tablet Take 1 tablet (100 mg total) by mouth 3 (three) times daily as needed for pain. 03/07/19   Avegno, Darrelyn Hillock, FNP    Allergies    Lidocaine, Sulfa antibiotics, and Iohexol  Review of Systems   Review of Systems  Constitutional: Negative for fever.  HENT: Positive for dental problem and facial swelling. Negative for sore throat.   Eyes: Negative for pain and redness.  Respiratory: Negative for shortness of breath.   Cardiovascular: Negative.   Gastrointestinal: Negative.   Musculoskeletal: Negative for neck pain and neck stiffness.  All other systems reviewed and are negative.   Physical Exam Updated Vital Signs BP (!) 166/89 (BP Location: Right Arm)   Pulse (!) 107   Temp 98.8 F (37.1 C) (Oral)   Resp 18   Ht 5\' 9"  (1.753 m)   Wt 92.5 kg   LMP 04/22/2019   SpO2 98%   BMI 30.13 kg/m  Physical Exam Constitutional:      General: She is not in acute distress.    Appearance: She is well-developed.  HENT:     Head: Atraumatic.     Jaw: There is normal jaw occlusion. No trismus.     Right Ear: Tympanic membrane and external ear normal.     Left Ear: Tympanic membrane and external ear normal.     Mouth/Throat:     Mouth: No oral lesions.     Dentition: Abnormal dentition. Dental caries and dental abscesses present. No gingival swelling.     Pharynx: Oropharynx is clear. Uvula midline. No uvula swelling.     Comments: Multiple areas of dental decay.  Her left second premolar has a deep  cavity in the occlusive surface.  There is lateral gingival edema without fluctuance.  Her left cheek is mildly erythematous with soft edema which ends just inferior to the left lower eyelid.  There is subtle induration along her left nostril.  There is no periorbital pain and pain is not elicited with EOM movements. Eyes:     Conjunctiva/sclera: Conjunctivae normal.  Cardiovascular:     Rate and Rhythm: Normal rate.  Pulmonary:     Effort: Pulmonary effort is normal.  Musculoskeletal:        General: Normal range of motion.     Cervical back: Normal range of motion and neck supple.  Lymphadenopathy:     Cervical: No cervical adenopathy.  Skin:    General: Skin is warm and dry.     Findings: No erythema.  Neurological:     Mental Status: She is alert and oriented to person, place, and time.     ED Results / Procedures / Treatments   Labs (all labs ordered are listed, but only abnormal results are displayed) Labs Reviewed - No data to display  EKG None  Radiology No results found.  Procedures Procedures (including critical care time)  Medications Ordered in ED Medications  clindamycin (CLEOCIN) IVPB 600 mg (0 mg Intravenous Stopped 05/15/19 2248)  ibuprofen (ADVIL) tablet 800 mg (800 mg Oral Given 05/15/19 2248)    ED Course  I have reviewed the triage vital signs and the nursing notes.  Pertinent labs & imaging results that were available during my care of the patient were reviewed by me and considered in my medical decision making (see chart for details).    MDM Rules/Calculators/A&P                      Patient with significant dental abscess and mild left facial erythema.  There is no evidence for posterior ocular involvement.  She was given IV clindamycin and ibuprofen.  She felt improving symptoms by the time of her discharge.  She was prescribed further clindamycin for a full 7-day course.  She asked for a Diflucan tablet in the event she develops yeast infection.   She is planning to contact [facility] for definitive treatment of her dental issues.  She was also given dental referral to Dr. Leanord Asal as another contact if symptoms persist or worsen.  Return precautions were outlined. Final Clinical Impression(s) / ED Diagnoses Final diagnoses:  Dental abscess    Rx / DC Orders ED Discharge Orders         Ordered    clindamycin (CLEOCIN) 150 MG capsule  Every 6 hours     05/15/19 2337    fluconazole (DIFLUCAN) 150 MG tablet   Once     05/15/19  2337           Burgess Amor, PA-C 05/16/19 Rhea Belton, MD 05/17/19 1020

## 2019-05-15 NOTE — Discharge Instructions (Addendum)
Complete your entire course of antibiotics as prescribed.  Y Avoid applying heat or ice to this abscess area which can worsen your symptoms.  You may use warm salt water swish and spit treatment or half peroxide and water swish and spit after meals to keep this area clean as discussed.  Call the dentist listed above for further management of your symptoms.

## 2019-05-15 NOTE — ED Triage Notes (Signed)
Patient has facial swelling to the left side/possible dental abscess. Patient has facial swelling that has spread from her jaw up to her eye. Patient states that the swelling started yesterday.

## 2020-10-13 ENCOUNTER — Emergency Department (HOSPITAL_COMMUNITY)
Admission: EM | Admit: 2020-10-13 | Discharge: 2020-10-13 | Disposition: A | Discharge status: Home / Self Care | Payer: Self-pay | Attending: Emergency Medicine | Admitting: Emergency Medicine

## 2020-10-13 ENCOUNTER — Encounter (HOSPITAL_COMMUNITY): Payer: Self-pay | Admitting: Emergency Medicine

## 2020-10-13 DIAGNOSIS — K047 Periapical abscess without sinus: Secondary | ICD-10-CM

## 2020-10-13 DIAGNOSIS — F17200 Nicotine dependence, unspecified, uncomplicated: Secondary | ICD-10-CM | POA: Insufficient documentation

## 2020-10-13 DIAGNOSIS — R030 Elevated blood-pressure reading, without diagnosis of hypertension: Secondary | ICD-10-CM

## 2020-10-13 DIAGNOSIS — R220 Localized swelling, mass and lump, head: Secondary | ICD-10-CM

## 2020-10-13 DIAGNOSIS — K029 Dental caries, unspecified: Secondary | ICD-10-CM | POA: Insufficient documentation

## 2020-10-13 MED ORDER — CEFTRIAXONE SODIUM 1 G IJ SOLR
1.0000 g | Freq: Once | INTRAMUSCULAR | Status: AC
  Administered 2020-10-13: 1 g via INTRAMUSCULAR
  Filled 2020-10-13: qty 10

## 2020-10-13 MED ORDER — ACETAMINOPHEN 500 MG PO TABS
1000.0000 mg | ORAL_TABLET | Freq: Once | ORAL | Status: AC
  Administered 2020-10-13: 1000 mg via ORAL
  Filled 2020-10-13: qty 2

## 2020-10-13 MED ORDER — LIDOCAINE HCL (PF) 1 % IJ SOLN
INTRAMUSCULAR | Status: AC
  Administered 2020-10-13: 2 mL
  Filled 2020-10-13: qty 5

## 2020-10-13 MED ORDER — IBUPROFEN 400 MG PO TABS
400.0000 mg | ORAL_TABLET | Freq: Once | ORAL | Status: AC
  Administered 2020-10-13: 400 mg via ORAL
  Filled 2020-10-13: qty 1

## 2020-10-13 MED ORDER — CLINDAMYCIN HCL 150 MG PO CAPS
300.0000 mg | ORAL_CAPSULE | Freq: Once | ORAL | Status: AC
  Administered 2020-10-13: 300 mg via ORAL
  Filled 2020-10-13: qty 2

## 2020-10-13 MED ORDER — CLINDAMYCIN HCL 300 MG PO CAPS: 300.0000 mg | ORAL_CAPSULE | Freq: Three times a day (TID) | ORAL | 0 refills | Status: DC

## 2020-10-13 NOTE — ED Triage Notes (Signed)
Pt reports dental pain x 3 days.  Seen at [facility] yesterday and given Penicillin.  Took dose of her mom's amoxicillin last night.  Woke up with swelling to face.  R side of face worse with R eye swelling.  Denies dental pain at present.

## 2020-10-13 NOTE — Discharge Instructions (Addendum)
It was our pleasure to provide your ER care today - we hope that you feel better.  Also take clindamycin as prescribed.   Take acetaminophen or ibuprofen as need.   Elevate head, including while sleeping, to help minimize swelling.   Follow up with dentist this Monday or Tuesday.  Your blood pressure is high today - limit salt intake, eat heart healthy eating plan, and follow up with primary care  doctor for recheck this coming week.   Return to ER if worse, new symptoms, high fevers, severe swelling/pain, spreading redness, or other concern.

## 2020-10-13 NOTE — ED Provider Notes (Signed)
[facility] EMERGENCY DEPARTMENT Provider Note   CSN: [id number] Arrival date & time: 10/13/20  1300     History No chief complaint on file.   Felicia Livingston is a 45 y.o. female.  Patient c/o pain to right upper tooth, and right facial area swelling. Symptoms acute onset in past three days, dull, moderate, constant, persistent. Went to outside ED yesterday - given rx pcn. Pt notes after sleeping/lying flat last night, when awoke this AM, mild increased swelling to right face. No fever/chills/sweats. No eye pain or change in vision. No headache. No neck pain or stiffness. No throat pain or swelling. No trouble breathing or swallowing. No pain or swelling to floor of mouth or neck.   The history is provided by the patient.      History reviewed. No pertinent past medical history.  There are no problems to display for this patient.   Past Surgical History:  Procedure Laterality Date   APPENDECTOMY       OB History   No obstetric history on file.     No family history on file.  Social History   Tobacco Use   Smoking status: Every Day   Smokeless tobacco: Never  Substance Use Topics   Alcohol use: No   Drug use: No    Home Medications Prior to Admission medications   Medication Sig Start Date End Date Taking? Authorizing Provider  doxycycline (VIBRAMYCIN) 100 MG capsule Take 1 capsule (100 mg total) by mouth 2 (two) times daily. 09/28/15   Janne Napoleon, NP  HYDROcodone-acetaminophen (NORCO/VICODIN) 5-325 MG tablet Take 1 tablet by mouth every 4 (four) hours as needed. 09/28/15   Janne Napoleon, NP  ibuprofen (ADVIL,MOTRIN) 200 MG tablet Take 200 mg by mouth every 6 (six) hours as needed.    [provider]  metroNIDAZOLE (FLAGYL) 500 MG tablet One po bid x 14 days  DO NOT DRINK ALCOHOL WITH THIS MEDICATION 04/26/15   Zadie Rhine, MD  oxyCODONE-acetaminophen (PERCOCET/ROXICET) 5-325 MG tablet Take 1 tablet by mouth every 8 (eight) hours as  needed for severe pain. 04/26/15   Zadie Rhine, MD  phenazopyridine (PYRIDIUM) 100 MG tablet Take 1 tablet (100 mg total) by mouth 3 (three) times daily as needed for pain. 03/07/19   Avegno, Zachery Dakins, FNP    Allergies    Lidocaine, Sulfa antibiotics, and Iohexol  Review of Systems   Review of Systems  Constitutional:  Negative for chills and fever.  HENT:  Positive for dental problem. Negative for sore throat.   Eyes:  Negative for photophobia, pain, discharge, redness and visual disturbance.  Respiratory:  Negative for shortness of breath.   Cardiovascular:  Negative for chest pain.  Gastrointestinal:  Negative for abdominal pain.  Genitourinary:  Negative for flank pain.  Musculoskeletal:  Negative for neck pain and neck stiffness.  Skin:  Negative for rash.  Neurological:  Negative for headaches.  Hematological:  Does not bruise/bleed easily.  Psychiatric/Behavioral:  Negative for confusion.    Physical Exam Updated Vital Signs BP (!) 161/103 (BP Location: Right Arm)   Pulse (!) 114   Temp 98.6 F (37 C) (Oral)   Resp 18   SpO2 96%   Physical Exam Vitals and nursing note reviewed.  Constitutional:      Appearance: Normal appearance. She is well-developed.  HENT:     Head: Atraumatic.     Nose: Nose normal.     Mouth/Throat:     Mouth: Mucous  membranes are moist.     Pharynx: Oropharynx is clear. No oropharyngeal exudate or posterior oropharyngeal erythema.     Comments: Upper dental decay, mild gum swelling ?dental abscess. Mild right facial swelling. No orbital or periorbital cellulitis noted. No pain w eom. Pharynx normal. No swelling, pain or tenderness to floor of mouth.  Eyes:     General: No scleral icterus.    Extraocular Movements: Extraocular movements intact.     Conjunctiva/sclera: Conjunctivae normal.     Pupils: Pupils are equal, round, and reactive to light.  Neck:     Trachea: No tracheal deviation.     Comments: Trachea midline. No neck  swelling or tenderness. No stiffness or rigidity.  Cardiovascular:     Rate and Rhythm: Normal rate and regular rhythm.     Pulses: Normal pulses.     Heart sounds: Normal heart sounds. No murmur heard.   No friction rub. No gallop.  Pulmonary:     Effort: Pulmonary effort is normal. No respiratory distress.     Breath sounds: Normal breath sounds.  Abdominal:     General: There is no distension.  Genitourinary:    Comments: No cva tenderness.  Musculoskeletal:        General: No swelling.     Cervical back: Normal range of motion and neck supple. No rigidity. No muscular tenderness.  Skin:    General: Skin is warm and dry.     Findings: No rash.  Neurological:     Mental Status: She is alert.     Comments: Alert, speech normal.   Psychiatric:        Mood and Affect: Mood normal.    ED Results / Procedures / Treatments   Labs (all labs ordered are listed, but only abnormal results are displayed) Labs Reviewed - No data to display  EKG None  Radiology No results found.  Procedures Procedures   Medications Ordered in ED Medications  cefTRIAXone (ROCEPHIN) injection 1 g (has no administration in time range)  clindamycin (CLEOCIN) capsule 300 mg (has no administration in time range)    ED Course  I have reviewed the triage vital signs and the nursing notes.  Pertinent labs & imaging results that were available during my care of the patient were reviewed by me and considered in my medical decision making (see chart for details).    MDM Rules/Calculators/A&P                           Will give dose rocephin and clinda in ED. Acetaminophen po. Ibuprofen po.   Reviewed nursing notes and prior charts for additional history. Prior visit with similar issue noted - tx'd with clinda then.   Will add rx clinda at home to pts current abx tx.   Rec dental f/u.  Rec pcp f/u re htn.   Return precautions provided.     Final Clinical Impression(s) / ED  Diagnoses Final diagnoses:  None    Rx / DC Orders ED Discharge Orders     None        Cathren Laine, MD 10/13/20 1422

## 2020-11-20 ENCOUNTER — Inpatient Hospital Stay (HOSPITAL_COMMUNITY)
Admission: EM | Admit: 2020-11-20 | Discharge: 2020-11-23 | DRG: 871 | Disposition: A | Discharge status: Home / Self Care | Payer: Self-pay | Attending: Internal Medicine | Admitting: Internal Medicine

## 2020-11-20 ENCOUNTER — Emergency Department (HOSPITAL_COMMUNITY): Payer: Self-pay

## 2020-11-20 ENCOUNTER — Other Ambulatory Visit: Payer: Self-pay

## 2020-11-20 ENCOUNTER — Encounter (HOSPITAL_COMMUNITY): Payer: Self-pay | Admitting: Student

## 2020-11-20 DIAGNOSIS — N12 Tubulo-interstitial nephritis, not specified as acute or chronic: Secondary | ICD-10-CM | POA: Diagnosis present

## 2020-11-20 DIAGNOSIS — N390 Urinary tract infection, site not specified: Secondary | ICD-10-CM | POA: Diagnosis present

## 2020-11-20 DIAGNOSIS — A419 Sepsis, unspecified organism: Principal | ICD-10-CM | POA: Diagnosis present

## 2020-11-20 DIAGNOSIS — Z884 Allergy status to anesthetic agent status: Secondary | ICD-10-CM

## 2020-11-20 DIAGNOSIS — Z79899 Other long term (current) drug therapy: Secondary | ICD-10-CM

## 2020-11-20 DIAGNOSIS — Z882 Allergy status to sulfonamides status: Secondary | ICD-10-CM

## 2020-11-20 DIAGNOSIS — N10 Acute tubulo-interstitial nephritis: Secondary | ICD-10-CM | POA: Diagnosis present

## 2020-11-20 DIAGNOSIS — F17210 Nicotine dependence, cigarettes, uncomplicated: Secondary | ICD-10-CM | POA: Diagnosis present

## 2020-11-20 DIAGNOSIS — Z28310 Unvaccinated for covid-19: Secondary | ICD-10-CM

## 2020-11-20 DIAGNOSIS — Z91041 Radiographic dye allergy status: Secondary | ICD-10-CM

## 2020-11-20 DIAGNOSIS — U071 COVID-19: Secondary | ICD-10-CM | POA: Diagnosis present

## 2020-11-20 DIAGNOSIS — E876 Hypokalemia: Secondary | ICD-10-CM | POA: Diagnosis not present

## 2020-11-20 LAB — CBC WITH DIFFERENTIAL/PLATELET
Abs Immature Granulocytes: 0.05 10*3/uL (ref 0.00–0.07)
Basophils Absolute: 0.1 10*3/uL (ref 0.0–0.1)
Basophils Relative: 1 %
Eosinophils Absolute: 0 10*3/uL (ref 0.0–0.5)
Eosinophils Relative: 0 %
HCT: 37.8 % (ref 36.0–46.0)
Hemoglobin: 11.8 g/dL — ABNORMAL LOW (ref 12.0–15.0)
Immature Granulocytes: 0 %
Lymphocytes Relative: 8 %
Lymphs Abs: 1 10*3/uL (ref 0.7–4.0)
MCH: 25.5 pg — ABNORMAL LOW (ref 26.0–34.0)
MCHC: 31.2 g/dL (ref 30.0–36.0)
MCV: 81.6 fL (ref 80.0–100.0)
Monocytes Absolute: 0.8 10*3/uL (ref 0.1–1.0)
Monocytes Relative: 6 %
Neutro Abs: 11.1 10*3/uL — ABNORMAL HIGH (ref 1.7–7.7)
Neutrophils Relative %: 85 %
Platelets: 280 10*3/uL (ref 150–400)
RBC: 4.63 MIL/uL (ref 3.87–5.11)
RDW: 16.4 % — ABNORMAL HIGH (ref 11.5–15.5)
WBC: 13 10*3/uL — ABNORMAL HIGH (ref 4.0–10.5)
nRBC: 0 % (ref 0.0–0.2)

## 2020-11-20 LAB — COMPREHENSIVE METABOLIC PANEL
ALT: 23 U/L (ref 0–44)
AST: 20 U/L (ref 15–41)
Albumin: 3.5 g/dL (ref 3.5–5.0)
Alkaline Phosphatase: 114 U/L (ref 38–126)
Anion gap: 10 (ref 5–15)
BUN: 7 mg/dL (ref 6–20)
CO2: 26 mmol/L (ref 22–32)
Calcium: 9.1 mg/dL (ref 8.9–10.3)
Chloride: 97 mmol/L — ABNORMAL LOW (ref 98–111)
Creatinine, Ser: 0.72 mg/dL (ref 0.44–1.00)
GFR, Estimated: >60 mL/min (ref >60)
Glucose, Bld: 138 mg/dL — ABNORMAL HIGH (ref 70–99)
Potassium: 3.8 mmol/L (ref 3.5–5.1)
Sodium: 133 mmol/L — ABNORMAL LOW (ref 135–145)
Total Bilirubin: 0.7 mg/dL (ref 0.3–1.2)
Total Protein: 7.4 g/dL (ref 6.5–8.1)

## 2020-11-20 LAB — I-STAT BETA HCG BLOOD, ED (MC, WL, AP ONLY): I-stat hCG, quantitative: <5 m[IU]/mL (ref <5)

## 2020-11-20 LAB — URINALYSIS, ROUTINE W REFLEX MICROSCOPIC
Bilirubin Urine: NEGATIVE
Glucose, UA: NEGATIVE mg/dL
Ketones, ur: NEGATIVE mg/dL
Nitrite: NEGATIVE
Protein, ur: 30 mg/dL — AB
Specific Gravity, Urine: 1.02 (ref 1.005–1.030)
WBC, UA: >50 WBC/hpf — ABNORMAL HIGH (ref 0–5)
pH: 5 (ref 5.0–8.0)

## 2020-11-20 LAB — WET PREP, GENITAL
Clue Cells Wet Prep HPF POC: NONE SEEN
Sperm: NONE SEEN
Trich, Wet Prep: NONE SEEN
WBC, Wet Prep HPF POC: NONE SEEN
Yeast Wet Prep HPF POC: NONE SEEN

## 2020-11-20 LAB — LIPASE, BLOOD: Lipase: 21 U/L (ref 11–51)

## 2020-11-20 LAB — APTT: aPTT: 30 seconds (ref 24–36)

## 2020-11-20 LAB — PROTIME-INR
INR: 1.1 (ref 0.8–1.2)
Prothrombin Time: 14.1 seconds (ref 11.4–15.2)

## 2020-11-20 MED ORDER — DIPHENHYDRAMINE HCL 50 MG/ML IJ SOLN
50.0000 mg | Freq: Once | INTRAMUSCULAR | Status: AC
  Administered 2020-11-20: 50 mg via INTRAVENOUS
  Filled 2020-11-20: qty 1

## 2020-11-20 MED ORDER — DIPHENHYDRAMINE HCL 50 MG/ML IJ SOLN
50.0000 mg | Freq: Once | INTRAMUSCULAR | Status: AC
  Administered 2020-11-21: 50 mg via INTRAVENOUS
  Filled 2020-11-20: qty 1

## 2020-11-20 MED ORDER — HYDROCORTISONE SOD SUC (PF) 250 MG IJ SOLR
200.0000 mg | Freq: Once | INTRAMUSCULAR | Status: AC
  Administered 2020-11-21: 200 mg via INTRAVENOUS
  Filled 2020-11-20: qty 200

## 2020-11-20 MED ORDER — DIPHENHYDRAMINE HCL 25 MG PO CAPS: 50.0000 mg | ORAL_CAPSULE | Freq: Once | ORAL | Status: AC

## 2020-11-20 MED ORDER — ACETAMINOPHEN 500 MG PO TABS
1000.0000 mg | ORAL_TABLET | Freq: Once | ORAL | Status: AC
  Administered 2020-11-20: 1000 mg via ORAL
  Filled 2020-11-20: qty 2

## 2020-11-20 MED ORDER — MORPHINE SULFATE (PF) 4 MG/ML IV SOLN
4.0000 mg | Freq: Once | INTRAVENOUS | Status: AC
  Administered 2020-11-20: 4 mg via INTRAVENOUS
  Filled 2020-11-20: qty 1

## 2020-11-20 MED ORDER — ONDANSETRON HCL 4 MG/2ML IJ SOLN
4.0000 mg | Freq: Once | INTRAMUSCULAR | Status: AC
  Administered 2020-11-20: 4 mg via INTRAVENOUS
  Filled 2020-11-20: qty 2

## 2020-11-20 MED ORDER — LACTATED RINGERS IV BOLUS
1000.0000 mL | Freq: Once | INTRAVENOUS | Status: AC
  Administered 2020-11-20: 1000 mL via INTRAVENOUS

## 2020-11-20 MED ORDER — SODIUM CHLORIDE 0.9 % IV SOLN
1.0000 g | Freq: Once | INTRAVENOUS | Status: AC
  Administered 2020-11-20: 1 g via INTRAVENOUS
  Filled 2020-11-20: qty 10

## 2020-11-20 NOTE — ED Provider Notes (Signed)
Emergency Medicine Provider Triage Evaluation Note  Felicia Livingston , a 45 y.o. female  was evaluated in triage.  Pt complains of abdominal pain.  Patient states that she has had left lower quadrant abdominal pain for 2 days.  Pain has gradually worsened.  Pain is constant. she has had a fever that went up to 103.  She also has associated nausea and vomiting.  She has some diarrhea.  She is still passing gas.  She also endorses some associated pelvic pressure.  She denies any chest pain, difficulty breathing.   She has a history of appendectomy.  She still has her uterus and both ovaries.  She is not currently sexually active.  She has not been sexually active for 18 months so there is no chance of her being pregnant.  Denies any vaginal discharge or abnormal bleeding so low suspicion for PID.  Review of Systems  Positive: Abdominal pain, nausea, vomiting, diarrhea, fever Negative: Chest pain, shortness of breath, vaginal discharge, urinary symptoms  Physical Exam  BP (!) 146/83   Pulse (!) 131   Temp 100.2 F (37.9 C) (Oral)   Resp 16   SpO2 97%  Gen:   Awake, appears to be very uncomfortable and in pain. Resp:  Normal effort MSK:   Moves extremities without difficulty  Other:  Abdomen with distention.  Abdomen soft.  Tender to to palpation in the left lower quadrant.  Medical Decision Making  Medically screening exam initiated at 4:31 PM.  Appropriate orders placed.  Candace Cruise was informed that the remainder of the evaluation will be completed by another provider, this initial triage assessment does not replace that evaluation, and the importance of remaining in the ED until their evaluation is complete.    Claudie Leach, PA-C 11/20/20 1634    Virgina Norfolk, DO 11/20/20 2150

## 2020-11-20 NOTE — ED Provider Notes (Signed)
[facility] EMERGENCY DEPARTMENT Provider Note   CSN: [id number] Arrival date & time: 11/20/20  1529     History Chief Complaint  Patient presents with   Abdominal Pain   Fever    Felicia Livingston is a 45 y.o. female with a hx of tobacco abuse and prior appendectomy who presents to the ED with complaints of abdominal pain x 3-4 days. Pain is constant, progressively worsening, primarily in the LLQ with some radiating to the back/pelvis. She has associated fever, nausea, vomiting, diarrhea, suprapubic pressure, and urinary frequency. Pain is worse in certain positions, no alleviating factors. She states that her significant other is incarcerated, they have phone sex sometimes, she did recently use a new toy and is concerned this may be related to the problem, however pain did not start immediately during use. Denies hematemesis, melena, hematochezia, vaginal bleeding/discharge, cough, dyspnea, or chest pain. No concern for STI.   HPI     No past medical history on file.  There are no problems to display for this patient.   Past Surgical History:  Procedure Laterality Date   APPENDECTOMY       OB History   No obstetric history on file.     No family history on file.  Social History   Tobacco Use   Smoking status: Every Day   Smokeless tobacco: Never  Substance Use Topics   Alcohol use: No   Drug use: No    Home Medications Prior to Admission medications   Medication Sig Start Date End Date Taking? Authorizing Provider  clindamycin (CLEOCIN) 300 MG capsule Take 1 capsule (300 mg total) by mouth 3 (three) times daily. 10/13/20   Cathren Laine, MD  doxycycline (VIBRAMYCIN) 100 MG capsule Take 1 capsule (100 mg total) by mouth 2 (two) times daily. 09/28/15   Janne Napoleon, NP  HYDROcodone-acetaminophen (NORCO/VICODIN) 5-325 MG tablet Take 1 tablet by mouth every 4 (four) hours as needed. 09/28/15   Janne Napoleon, NP  ibuprofen (ADVIL,MOTRIN) 200 MG tablet  Take 200 mg by mouth every 6 (six) hours as needed.    [provider]  metroNIDAZOLE (FLAGYL) 500 MG tablet One po bid x 14 days  DO NOT DRINK ALCOHOL WITH THIS MEDICATION 04/26/15   Zadie Rhine, MD  oxyCODONE-acetaminophen (PERCOCET/ROXICET) 5-325 MG tablet Take 1 tablet by mouth every 8 (eight) hours as needed for severe pain. 04/26/15   Zadie Rhine, MD  phenazopyridine (PYRIDIUM) 100 MG tablet Take 1 tablet (100 mg total) by mouth 3 (three) times daily as needed for pain. 03/07/19   Avegno, Zachery Dakins, FNP    Allergies    Lidocaine, Sulfa antibiotics, and Iohexol  Review of Systems   Review of Systems  Constitutional:  Positive for chills and fever.  Respiratory:  Negative for cough and shortness of breath.   Cardiovascular:  Negative for chest pain.  Gastrointestinal:  Positive for abdominal pain, diarrhea, nausea and vomiting. Negative for blood in stool.  Genitourinary:  Positive for frequency. Negative for vaginal bleeding and vaginal discharge.  Neurological:  Negative for syncope.  All other systems reviewed and are negative.  Physical Exam Updated Vital Signs BP 132/83 (BP Location: Right Arm)   Pulse (!) 123   Temp (!) 101.2 F (38.4 C) (Oral)   Resp 14   SpO2 96%   Physical Exam Vitals and nursing note reviewed.  Constitutional:      General: She is not in acute distress.    Appearance: She  is well-developed. She is not toxic-appearing.  HENT:     Head: Normocephalic and atraumatic.  Eyes:     General:        Right eye: No discharge.        Left eye: No discharge.     Conjunctiva/sclera: Conjunctivae normal.  Cardiovascular:     Rate and Rhythm: Regular rhythm. Tachycardia present.  Pulmonary:     Effort: Pulmonary effort is normal. No respiratory distress.     Breath sounds: Normal breath sounds. No wheezing, rhonchi or rales.  Abdominal:     General: There is no distension.     Palpations: Abdomen is soft.     Tenderness: There is  abdominal tenderness (generalized with increased focality to the LLQ).  Genitourinary:    Comments: No significant external lesions.  White discharge present.  Diffuse tenderness noted.  Musculoskeletal:     Cervical back: Neck supple.  Skin:    General: Skin is warm.     Findings: No rash.     Comments: Hot to the touch. Mildly diaphoretic.   Neurological:     Mental Status: She is alert.     Comments: Clear speech.   Psychiatric:        Behavior: Behavior normal.    ED Results / Procedures / Treatments   Labs (all labs ordered are listed, but only abnormal results are displayed) Labs Reviewed  CBC WITH DIFFERENTIAL/PLATELET - Abnormal; Notable for the following components:      Result Value   WBC 13.0 (*)    Hemoglobin 11.8 (*)    MCH 25.5 (*)    RDW 16.4 (*)    Neutro Abs 11.1 (*)    All other components within normal limits  COMPREHENSIVE METABOLIC PANEL - Abnormal; Notable for the following components:   Sodium 133 (*)    Chloride 97 (*)    Glucose, Bld 138 (*)    All other components within normal limits  URINALYSIS, ROUTINE W REFLEX MICROSCOPIC - Abnormal; Notable for the following components:   Color, Urine AMBER (*)    APPearance CLOUDY (*)    Hgb urine dipstick SMALL (*)    Protein, ur 30 (*)    Leukocytes,Ua LARGE (*)    WBC, UA >50 (*)    Bacteria, UA RARE (*)    All other components within normal limits  LIPASE, BLOOD  I-STAT BETA HCG BLOOD, ED ([facility], [facility], [facility] ONLY)    EKG None  Radiology CT Abdomen Pelvis W Contrast  Result Date: 11/21/2020 CLINICAL DATA:  Left lower quadrant abdominal pain x2 days, diarrhea, fever EXAM: CT ABDOMEN AND PELVIS WITH CONTRAST TECHNIQUE: Multidetector CT imaging of the abdomen and pelvis was performed using the standard protocol following bolus administration of intravenous contrast. CONTRAST:  27mL OMNIPAQUE IOHEXOL 350 MG/ML SOLN COMPARISON:  06/26/2015 FINDINGS: Lower chest: Lung bases are clear. Hepatobiliary: Liver is  within normal limits. Gallbladder is unremarkable. No intrahepatic or extrahepatic ductal dilatation. Pancreas: Within normal limits. Spleen: Within normal limits. Adrenals/Urinary Tract: Adrenal glands are within normal limits. On delayed imaging, there is mildly heterogeneous enhancement of the posterior left lower kidney (series 8/image 24), nonspecific but suggesting pyelonephritis. Kidneys are otherwise unremarkable. No hydronephrosis. Bladder is within normal limits. Stomach/Bowel: Stomach is within normal limits. No evidence of bowel obstruction. Prior appendectomy. No colonic wall thickening or inflammatory changes. Vascular/Lymphatic: No evidence of abdominal aortic aneurysm. Mild atherosclerotic calcifications of the infrarenal abdominal aorta. No suspicious abdominopelvic lymphadenopathy. Reproductive: Uterus is within normal  limits. Bilateral ovaries are within normal limits. Other: No abdominopelvic ascites. Musculoskeletal: Visualized osseous structures are within normal limits. IMPRESSION: Heterogeneous enhancement of the left lower kidney, nonspecific but suggesting pyelonephritis. Otherwise negative CT abdomen/pelvis. Electronically Signed   By: Charline Bills M.D.   On: 11/21/2020 03:56    Procedures .Critical Care Performed by: Cherly Anderson, PA-C Authorized by: Cherly Anderson, PA-C    CRITICAL CARE Performed by: Harvie Heck   Total critical care time: 35 minutes  Critical care time was exclusive of separately billable procedures and treating other patients.  Critical care was necessary to treat or prevent imminent or life-threatening deterioration.  Critical care was time spent personally by me on the following activities: development of treatment plan with patient and/or surrogate as well as nursing, discussions with consultants, evaluation of patient's response to treatment, examination of patient, obtaining history from patient or surrogate,  ordering and performing treatments and interventions, ordering and review of laboratory studies, ordering and review of radiographic studies, pulse oximetry and re-evaluation of patient's condition.  Medications Ordered in ED Medications  acetaminophen (TYLENOL) tablet 1,000 mg (1,000 mg Oral Given 11/20/20 1609)  diphenhydrAMINE (BENADRYL) injection 50 mg (50 mg Intravenous Given 11/20/20 1643)    ED Course  I have reviewed the triage vital signs and the nursing notes.  Pertinent labs & imaging results that were available during my care of the patient were reviewed by me and considered in my medical decision making (see chart for details).    MDM Rules/Calculators/A&P                           Patient presents to the ED with complaints of abdominal pain. Patient is febrile & tachycardic. She has generalized abdominal tenderness w/ increased LLQ tenderness to palpation.   Additional history obtained:  Additional history obtained from chart review & nursing note review.   Lab Tests:  I reviewed and interpreted labs, which included:  CBC: Leukocytosis, mild anemia.  CMP: mild hyperglycemia.  Lipase: WNL Preg test: Negative UA: concerning for infection- sent for culture.   Performed pelvic, mild white discharge, diffuse tenderness, no focal CMT, no bleeding or visible signs of injury. Given patient is febrile with tachycardia & leukocytosis will check lactic acid & blood culture, abx initiated, fluids started, and symptomatic care with morphine/zofran. Patient has contrast dye allergy- pre medication treatment started.   Imaging Studies ordered:  I ordered imaging studies which included CT A/P, I independently reviewed, formal radiology impression shows: Heterogeneous enhancement of the left lower kidney, nonspecific but suggesting pyelonephritis. Otherwise negative CT abdomen/pelvis.   ED Course:  Patient presentation and work-up consistent w/ pyelonephritis, she remains with  discomfort on re-evaluation- additional analgesics ordered, given she remains uncomfortable and meets SIRS criteria plan for admission.   04:45: CONSULT: Discussed with hospitliast Dr. Toniann Fail- accepts admission.   Portions of this note were generated with Scientist, clinical (histocompatibility and immunogenetics). Dictation errors may occur despite best attempts at proofreading.  Final Clinical Impression(s) / ED Diagnoses Final diagnoses:  None    Rx / DC Orders ED Discharge Orders     None        Cherly Anderson, PA-C 11/21/20 0504    Melene Plan, DO 11/21/20 [phone]

## 2020-11-20 NOTE — ED Triage Notes (Addendum)
Pt with LLQ pain x 2 days and fever since yesterday. Tmax 103. Endorses nausea and vomiting. Has hx of cyst on her L ovary. Also thinks she may have ruptured something while having sex two nights ago.

## 2020-11-21 ENCOUNTER — Encounter (HOSPITAL_COMMUNITY): Payer: Self-pay | Admitting: Internal Medicine

## 2020-11-21 ENCOUNTER — Inpatient Hospital Stay (HOSPITAL_COMMUNITY): Payer: Self-pay

## 2020-11-21 ENCOUNTER — Emergency Department (HOSPITAL_COMMUNITY): Payer: Self-pay

## 2020-11-21 DIAGNOSIS — N12 Tubulo-interstitial nephritis, not specified as acute or chronic: Secondary | ICD-10-CM

## 2020-11-21 DIAGNOSIS — N390 Urinary tract infection, site not specified: Secondary | ICD-10-CM | POA: Diagnosis present

## 2020-11-21 DIAGNOSIS — U071 COVID-19: Secondary | ICD-10-CM | POA: Diagnosis present

## 2020-11-21 LAB — LACTIC ACID, PLASMA
Lactic Acid, Venous: 1.4 mmol/L (ref 0.5–1.9)
Lactic Acid, Venous: 1.6 mmol/L (ref 0.5–1.9)

## 2020-11-21 LAB — URINE CULTURE

## 2020-11-21 LAB — RESP PANEL BY RT-PCR (FLU A&B, COVID) ARPGX2
Influenza A by PCR: NEGATIVE
Influenza B by PCR: NEGATIVE
SARS Coronavirus 2 by RT PCR: POSITIVE — AB

## 2020-11-21 LAB — HIV ANTIBODY (ROUTINE TESTING W REFLEX): HIV Screen 4th Generation wRfx: NONREACTIVE

## 2020-11-21 LAB — D-DIMER, QUANTITATIVE: D-Dimer, Quant: 0.61 ug/mL-FEU — ABNORMAL HIGH (ref 0.00–0.50)

## 2020-11-21 LAB — C-REACTIVE PROTEIN: CRP: 23.4 mg/dL — ABNORMAL HIGH

## 2020-11-21 MED ORDER — SODIUM CHLORIDE 0.9 % IV SOLN
200.0000 mg | Freq: Once | INTRAVENOUS | Status: AC
  Administered 2020-11-21: 200 mg via INTRAVENOUS
  Filled 2020-11-21: qty 40

## 2020-11-21 MED ORDER — HYDROMORPHONE HCL 1 MG/ML IJ SOLN
0.5000 mg | Freq: Once | INTRAMUSCULAR | Status: AC
  Administered 2020-11-21: 0.5 mg via INTRAVENOUS
  Filled 2020-11-21: qty 1

## 2020-11-21 MED ORDER — LACTATED RINGERS IV SOLN: INTRAVENOUS | Status: DC

## 2020-11-21 MED ORDER — TRAMADOL HCL 50 MG PO TABS
50.0000 mg | ORAL_TABLET | Freq: Four times a day (QID) | ORAL | Status: DC | PRN
  Administered 2020-11-21 – 2020-11-22 (×4): 50 mg via ORAL
  Filled 2020-11-21 (×4): qty 1

## 2020-11-21 MED ORDER — IOHEXOL 350 MG/ML SOLN
50.0000 mL | Freq: Once | INTRAVENOUS | Status: AC | PRN
  Administered 2020-11-21: 50 mL via INTRAVENOUS

## 2020-11-21 MED ORDER — ACETAMINOPHEN 325 MG PO TABS
650.0000 mg | ORAL_TABLET | Freq: Once | ORAL | Status: AC
  Administered 2020-11-21: 650 mg via ORAL
  Filled 2020-11-21: qty 2

## 2020-11-21 MED ORDER — ENOXAPARIN SODIUM 40 MG/0.4ML IJ SOSY
40.0000 mg | PREFILLED_SYRINGE | INTRAMUSCULAR | Status: DC
  Administered 2020-11-21 – 2020-11-23 (×3): 40 mg via SUBCUTANEOUS
  Filled 2020-11-21 (×3): qty 0.4

## 2020-11-21 MED ORDER — ACETAMINOPHEN 650 MG RE SUPP: 650.0000 mg | Freq: Four times a day (QID) | RECTAL | Status: DC | PRN

## 2020-11-21 MED ORDER — LACTATED RINGERS IV BOLUS
1000.0000 mL | Freq: Once | INTRAVENOUS | Status: AC
  Administered 2020-11-21: 1000 mL via INTRAVENOUS

## 2020-11-21 MED ORDER — LACTATED RINGERS IV BOLUS
500.0000 mL | Freq: Once | INTRAVENOUS | Status: AC
  Administered 2020-11-21: 500 mL via INTRAVENOUS

## 2020-11-21 MED ORDER — SODIUM CHLORIDE 0.9 % IV SOLN
100.0000 mg | Freq: Every day | INTRAVENOUS | Status: DC
  Administered 2020-11-22: 100 mg via INTRAVENOUS
  Filled 2020-11-21 (×2): qty 20

## 2020-11-21 MED ORDER — HYDRALAZINE HCL 25 MG PO TABS: 25.0000 mg | ORAL_TABLET | Freq: Three times a day (TID) | ORAL | Status: DC | PRN

## 2020-11-21 MED ORDER — ACETAMINOPHEN 325 MG PO TABS
650.0000 mg | ORAL_TABLET | Freq: Four times a day (QID) | ORAL | Status: DC | PRN
  Administered 2020-11-21 – 2020-11-22 (×6): 650 mg via ORAL
  Filled 2020-11-21 (×7): qty 2

## 2020-11-21 MED ORDER — SODIUM CHLORIDE 0.9 % IV SOLN
2.0000 g | INTRAVENOUS | Status: DC
Start: 2020-11-21 — End: 2020-11-23
  Administered 2020-11-21 – 2020-11-22 (×2): 2 g via INTRAVENOUS
  Filled 2020-11-21 (×2): qty 20

## 2020-11-21 NOTE — ED Notes (Signed)
Patient transported to CT 

## 2020-11-21 NOTE — Progress Notes (Signed)
Felicia Livingston is a 45 y.o. female patient admitted. Awake, alert - oriented  X 4 - no acute distress noted.  VSS - Blood pressure 130/74, pulse 87, temperature 98.2 F (36.8 C), temperature source Oral, resp. rate 18, height 5\' 9"  (1.753 m), weight 90.7 kg, SpO2 98 %.    IV in place, occlusive dsg intact without redness.  Orientation to room, and floor completed.  Admission INP armband ID verified with patient/family, and in place.   SR up x 2, fall assessment complete, with patient and family able to verbalize understanding of risk associated with falls, and verbalized understanding to call nsg before up out of bed.  Call light within reach, patient able to voice, and demonstrate understanding. No evidence of skin break down noted on exam.  Admission nurse notified of admission.   Will cont to eval and treat per MD orders.  , RN 11/21/2020 6:54 AM

## 2020-11-21 NOTE — Progress Notes (Signed)
PROGRESS NOTE    Felicia Livingston  PZW:[id number] DOB: 23-Jul-1975 DOA: 11/20/2020 PCP: Oneita Hurt, No    Brief Narrative:  Felicia Livingston is a 45 year old female with no significant past medical history who presented to [facility] ED on 9/27 with left flank pain, nausea/vomiting, fever/chills, diaphoresis that has been present for the last 2 days.  Also reports dysuria.  In the ED, temperature 102.9 F, HR 123, RR 14, BP 132/83, SPO2 95% on room air.  Sodium 133, potassium 3.8, chloride 97, CO2 26, BUN 7, creatinine 0.72, glucose 138.  Lipase 21, AST 20, ALT 23, total bilirubin 0.7.  Lactic acid 1.6.  WBC 13.0, hemoglobin 11.8, platelets 280.  Wet prep negative.  Urinalysis with large leukocytes, negative nitrite, rare bacteria, greater than 50 WBCs.  CT abdomen/pelvis with contrast with heterogeneous enhancement left lower kidney suggestive of pyelonephritis.  Chest x-ray with no active disease process.  Cova-19 incidentally positive.  Duration consulted for further evaluation and management of sepsis secondary to left acute pyelonephritis.   Assessment & Plan:   Principal Problem:   Pyelonephritis Active Problems:   Acute lower UTI   COVID-19 virus infection   Acute pylonephritis, left Patient presenting with 2-day history of left flank pain, dysuria, nausea/vomiting.  Urinalysis with large leukocytes, negative nitrite, rare bacteria, greater than 50 WBCs.  CT abdomen/pelvis with contrast with heterogeneous enhancement left lower kidney suggestive of pyelonephritis. Hx of previous Klebsiella pneumonia UTI. --Urine culture: Pending --Blood cultures x2: Pending --Ceftriaxone 2 g IV every 24 hours --LR at 84mL/hr --Tramadol 50mg  PO q6h prn for moderate pain --Supportive care, antipyretics --Continue monitor fever curve --CBC daily  COVID-19 viral infection, POA Incidentally found on positive COVID-19 PCR on admission.  Patient is asymptomatic, denies shortness of breath.  Chest x-ray with no acute  cardiopulmonary disease findings. --COVID test: + PCR 9/28 --CRP 23.4 --ddimer 0.61 --Remdesivir, plan 5-day course (Day #1/5) --Continue supportive care with albuterol MDI prn, vitamin C, zinc, Tylenol, antitussives (benzonatate/ Mucinex/Tussionex) --Follow CBC, CMP, D-dimer, ferritin, and CRP daily --Continue airborne/contact isolation precautions for 10 days weeks from the day of diagnosis  The treatment plan and use of medications and known side effects were discussed with patient/family. Some of the medications used are based on case reports/anecdotal data.  All other medications being used in the management of COVID-19 based on limited study data.  Complete risks and long-term side effects are unknown, however in the best clinical judgment they seem to be of some benefit.  Patient wanted to proceed with treatment options provided.   DVT prophylaxis: enoxaparin (LOVENOX) injection 40 mg Start: 11/21/20 0800   Code Status: Full Code Family Communication: No family present at bedside this morning  Disposition Plan:  Level of care: Telemetry Medical Status is: Inpatient  Remains inpatient appropriate because:Ongoing diagnostic testing needed not appropriate for outpatient work up, Unsafe d/c plan, IV treatments appropriate due to intensity of illness or inability to take PO, and Inpatient level of care appropriate due to severity of illness  Dispo: The patient is from: Home              Anticipated d/c is to: Home              Patient currently is not medically stable to d/c.   Difficult to place patient No   Consultants:  None  Procedures:  None  Antimicrobials:  Ceftriaxone 9/27>>   Subjective: Patient seen examined at bedside, resting comfortably, pacing around room.  States fever  and overall pain/chills improved.  Remains with intermittent fevers this afternoon.  Continues on IV antibiotics.  Urine/blood cultures are pending.  No other complaints or concerns at this time.   Denies headache, no dizziness, no night sweats, no chest pain, no palpitations, no shortness of breath, no abdominal pain, no weakness, no fatigue, no paresthesias.  No acute events overnight per nursing.  Objective: Vitals:   11/21/20 0555 11/21/20 0836 11/21/20 1216 11/21/20 1409  BP: 130/74 139/71 (!) 171/85 (!) 155/77  Pulse: 87 (!) 104 (!) 117 (!) 116  Resp: 18 18 16 20   Temp: 98.2 F (36.8 C) 98.4 F (36.9 C) (!) 101.4 F (38.6 C) (!) 100.9 F (38.3 C)  TempSrc: Oral Oral Oral Oral  SpO2: 98% 98% 98% 97%  Weight:      Height:        Intake/Output Summary (Last 24 hours) at 11/21/2020 1437 Last data filed at 11/21/2020 0403 Gross per 24 hour  Intake 1500 ml  Output --  Net 1500 ml   Filed Weights   11/21/20 0513  Weight: 90.7 kg    Examination:  General exam: Appears calm and comfortable  Respiratory system: Clear to auscultation. Respiratory effort normal.  On room air Cardiovascular system: S1 & S2 heard, RRR. No JVD, murmurs, rubs, gallops or clicks. No pedal edema. Gastrointestinal system: Abdomen is nondistended, soft and nontender. No organomegaly or masses felt. Normal bowel sounds heard. Central nervous system: Alert and oriented. No focal neurological deficits. Extremities: Symmetric 5 x 5 power. Skin: No rashes, lesions or ulcers Psychiatry: Judgement and insight appear normal. Mood & affect appropriate.     Data Reviewed: I have personally reviewed following labs and imaging studies  CBC: Recent Labs  Lab 11/20/20 1840  WBC 13.0*  NEUTROABS 11.1*  HGB 11.8*  HCT 37.8  MCV 81.6  PLT 280   Basic Metabolic Panel: Recent Labs  Lab 11/20/20 1840  NA 133*  K 3.8  CL 97*  CO2 26  GLUCOSE 138*  BUN 7  CREATININE 0.72  CALCIUM 9.1   GFR: Estimated Creatinine Clearance: 105.4 mL/min (by C-G formula based on SCr of 0.72 mg/dL). Liver Function Tests: Recent Labs  Lab 11/20/20 1840  AST 20  ALT 23  ALKPHOS 114  BILITOT 0.7  PROT 7.4   ALBUMIN 3.5   Recent Labs  Lab 11/20/20 1840  LIPASE 21   No results for input(s): AMMONIA in the last 168 hours. Coagulation Profile: Recent Labs  Lab 11/20/20 2320  INR 1.1   Cardiac Enzymes: No results for input(s): CKTOTAL, CKMB, CKMBINDEX, TROPONINI in the last 168 hours. BNP (last 3 results) No results for input(s): PROBNP in the last 8760 hours. HbA1C: No results for input(s): HGBA1C in the last 72 hours. CBG: No results for input(s): GLUCAP in the last 168 hours. Lipid Profile: No results for input(s): CHOL, HDL, LDLCALC, TRIG, CHOLHDL, LDLDIRECT in the last 72 hours. Thyroid Function Tests: No results for input(s): TSH, T4TOTAL, FREET4, T3FREE, THYROIDAB in the last 72 hours. Anemia Panel: No results for input(s): VITAMINB12, FOLATE, FERRITIN, TIBC, IRON, RETICCTPCT in the last 72 hours. Sepsis Labs: Recent Labs  Lab 11/20/20 2320 11/21/20 0051  LATICACIDVEN 1.6 1.4    Recent Results (from the past 240 hour(s))  Wet prep, genital     Status: None   Collection Time: 11/20/20 10:51 PM  Result Value Ref Range Status   Yeast Wet Prep HPF POC NONE SEEN NONE SEEN Final   Trich, Wet  Prep NONE SEEN NONE SEEN Final   Clue Cells Wet Prep HPF POC NONE SEEN NONE SEEN Final   WBC, Wet Prep HPF POC NONE SEEN NONE SEEN Final   Sperm NONE SEEN  Final    Comment: Performed at [facility], [address], [place], [place] [address]  Blood culture (routine x 2)     Status: None (Preliminary result)   Collection Time: 11/20/20 11:20 PM   Specimen: BLOOD  Result Value Ref Range Status   Specimen Description BLOOD LEFT ANTECUBITAL  Final   Special Requests   Final    BOTTLES DRAWN AEROBIC AND ANAEROBIC Blood Culture results may not be optimal due to an inadequate volume of blood received in culture bottles   Culture   Final    NO GROWTH < 12 HOURS Performed at [facility], [address], [place], [place] [address]    Report Status PENDING  Incomplete   Blood culture (routine x 2)     Status: None (Preliminary result)   Collection Time: 11/20/20 11:30 PM   Specimen: BLOOD  Result Value Ref Range Status   Specimen Description BLOOD SITE NOT SPECIFIED  Final   Special Requests   Final    BOTTLES DRAWN AEROBIC AND ANAEROBIC Blood Culture adequate volume   Culture   Final    NO GROWTH < 12 HOURS Performed at [facility], [address], [place], [place] [address]    Report Status PENDING  Incomplete  Resp Panel by RT-PCR (Flu A&B, Covid) Nasopharyngeal Swab     Status: Abnormal   Collection Time: 11/21/20  4:27 AM   Specimen: Nasopharyngeal Swab; Nasopharyngeal(NP) swabs in vial transport medium  Result Value Ref Range Status   SARS Coronavirus 2 by RT PCR POSITIVE (A) NEGATIVE Final    Comment: RESULT CALLED TO, READ BACK BY AND VERIFIED WITH: RN Deirdre Priest 11/21/20@6 :22 by TW (NOTE) SARS-CoV-2 target nucleic acids are DETECTED.  The SARS-CoV-2 RNA is generally detectable in upper respiratory specimens during the acute phase of infection. Positive results are indicative of the presence of the identified virus, but do not rule out bacterial infection or co-infection with other pathogens not detected by the test. Clinical correlation with patient history and other diagnostic information is necessary to determine patient infection status. The expected result is Negative.  Fact Sheet for Patients: BloggerCourse.com  Fact Sheet for Healthcare Providers: SeriousBroker.it  This test is not yet approved or cleared by the [place] FDA and  has been authorized for detection and/or diagnosis of SARS-CoV-2 by FDA under an Emergency Use Authorization (EUA).  This EUA will remain in effect (meaning this test can  be used) for the duration of  the COVID-19 declaration under Section 564(b)(1) of the Act, 21 U.S.C. section 360bbb-3(b)(1), unless the authorization is terminated or  revoked sooner.     Influenza A by PCR NEGATIVE NEGATIVE Final   Influenza B by PCR NEGATIVE NEGATIVE Final    Comment: (NOTE) The Xpert Xpress SARS-CoV-2/FLU/RSV plus assay is intended as an aid in the diagnosis of influenza from Nasopharyngeal swab specimens and should not be used as a sole basis for treatment. Nasal washings and aspirates are unacceptable for Xpert Xpress SARS-CoV-2/FLU/RSV testing.  Fact Sheet for Patients: BloggerCourse.com  Fact Sheet for Healthcare Providers: SeriousBroker.it  This test is not yet approved or cleared by the [place] FDA and has been authorized for detection and/or diagnosis of SARS-CoV-2 by FDA under an Emergency  Use Authorization (EUA). This EUA will remain in effect (meaning this test can be used) for the duration of the COVID-19 declaration under Section 564(b)(1) of the Act, 21 U.S.C. section 360bbb-3(b)(1), unless the authorization is terminated or revoked.  Performed at [facility], [address], [place], [place] [address]          Radiology Studies: CT Abdomen Pelvis W Contrast  Result Date: 11/21/2020 CLINICAL DATA:  Left lower quadrant abdominal pain x2 days, diarrhea, fever EXAM: CT ABDOMEN AND PELVIS WITH CONTRAST TECHNIQUE: Multidetector CT imaging of the abdomen and pelvis was performed using the standard protocol following bolus administration of intravenous contrast. CONTRAST:  33mL OMNIPAQUE IOHEXOL 350 MG/ML SOLN COMPARISON:  06/26/2015 FINDINGS: Lower chest: Lung bases are clear. Hepatobiliary: Liver is within normal limits. Gallbladder is unremarkable. No intrahepatic or extrahepatic ductal dilatation. Pancreas: Within normal limits. Spleen: Within normal limits. Adrenals/Urinary Tract: Adrenal glands are within normal limits. On delayed imaging, there is mildly heterogeneous enhancement of the posterior left lower kidney (series 8/image 24), nonspecific but  suggesting pyelonephritis. Kidneys are otherwise unremarkable. No hydronephrosis. Bladder is within normal limits. Stomach/Bowel: Stomach is within normal limits. No evidence of bowel obstruction. Prior appendectomy. No colonic wall thickening or inflammatory changes. Vascular/Lymphatic: No evidence of abdominal aortic aneurysm. Mild atherosclerotic calcifications of the infrarenal abdominal aorta. No suspicious abdominopelvic lymphadenopathy. Reproductive: Uterus is within normal limits. Bilateral ovaries are within normal limits. Other: No abdominopelvic ascites. Musculoskeletal: Visualized osseous structures are within normal limits. IMPRESSION: Heterogeneous enhancement of the left lower kidney, nonspecific but suggesting pyelonephritis. Otherwise negative CT abdomen/pelvis. Electronically Signed   By: Charline Bills M.D.   On: 11/21/2020 03:56   DG CHEST PORT 1 VIEW  Result Date: 11/21/2020 CLINICAL DATA:  COVID positive with kidney infection EXAM: PORTABLE CHEST 1 VIEW COMPARISON:  Abdominal CT from earlier the same day FINDINGS: Normal heart size and mediastinal contours. No acute infiltrate or edema. Asymmetric density of the chest attributed to rotation and breast positioning. No effusion or pneumothorax. No acute osseous findings. IMPRESSION: No active disease. Electronically Signed   By: Tiburcio Pea M.D.   On: 11/21/2020 07:20        Scheduled Meds:  enoxaparin (LOVENOX) injection  40 mg Subcutaneous Q24H   Continuous Infusions:  cefTRIAXone (ROCEPHIN)  IV     lactated ringers 75 mL/hr at 11/21/20 0601   [START ON 11/22/2020] remdesivir 100 mg in NS 100 mL       LOS: 0 days    Time spent: 39 minutes spent on chart review, discussion with nursing staff, consultants, updating family and interview/physical exam; more than 50% of that time was spent in counseling and/or coordination of care.    Alvira Philips [place], DO [facility] Available via Epic secure chat  7am-7pm After these hours, please refer to coverage provider listed on [email or web address] 11/21/2020, 2:37 PM

## 2020-11-21 NOTE — Progress Notes (Signed)
   11/21/20 1216  Assess: MEWS Score  Temp (!) 101.4 F (38.6 C)  BP (!) 171/85  Pulse Rate (!) 117  Resp 16  SpO2 98 %  O2 Device Room Air  Assess: MEWS Score  MEWS Temp 1  MEWS Systolic 0  MEWS Pulse 2  MEWS RR 0  MEWS LOC 0  MEWS Score 3  MEWS Score Color Yellow  Assess: if the MEWS score is Yellow or Red  Were vital signs taken at a resting state? Yes  Focused Assessment No change from prior assessment  Early Detection of Sepsis Score *See Row Information* Medium  MEWS guidelines implemented *See Row Information* Yes  Treat  MEWS Interventions Administered prn meds/treatments  Pain Scale 0-10  Pain Score 5  Pain Type Acute pain  Pain Location Generalized  Pain Intervention(s) Medication (See eMAR)  Escalate  MEWS: Escalate Yellow: discuss with charge nurse/RN and consider discussing with provider and RRT  Notify: Charge Nurse/RN  Name of Charge Nurse/RN Notified Tina, RN  Date Charge Nurse/RN Notified 11/21/20  Time Charge Nurse/RN Notified 1232  Notify: Provider  Provider Name/Title Metta Clines, MD  Date Provider Notified 11/21/20  Time Provider Notified 1232  Notification Type Page  Notification Reason Other (Comment) (per protocol)  Provider response Other (Comment) (awaiting)  Document  Patient Outcome Stabilized after interventions  Progress note created (see row info) Yes

## 2020-11-21 NOTE — H&P (Addendum)
History and Physical    NELLE SAYED NFA:[id number] DOB: 1975/12/23 DOA: 11/20/2020  PCP: Pcp, No  Patient coming from: Home.  Chief Complaint: Left flank pain.  HPI: Felicia Livingston is a 45 y.o. female with no significant past medical history presents with complaints of left flank pain with nausea vomiting fever chills diaphoresis for the last 2 days.  Has been having some dysuria.  Denies any vaginal discharge.  ED Course: In the ER patient was tachycardic with temperature 102.9 F diaphoresis and UA is consistent with UTI.  Lab work showed leukocytosis of 13,000 and CT scan shows features concerning for left-sided pyelonephritis.  Patient was started on empiric antibiotics admitted for IV antibiotics since patient has nausea vomiting.  COVID test is pending.  Review of Systems: As per HPI, rest all negative.   History reviewed. No pertinent past medical history.  Past Surgical History:  Procedure Laterality Date   APPENDECTOMY       reports that she has been smoking. She has never used smokeless tobacco. She reports that she does not drink alcohol and does not use drugs.  Allergies  Allergen Reactions   Lidocaine    Sulfa Antibiotics    Iohexol Hives    Pt. Had CT scan at [facility] and had hives after scan.       Family History  Family history unknown: Yes    Prior to Admission medications   Medication Sig Start Date End Date Taking? Authorizing Provider  clindamycin (CLEOCIN) 300 MG capsule Take 1 capsule (300 mg total) by mouth 3 (three) times daily. 10/13/20   Cathren Laine, MD  doxycycline (VIBRAMYCIN) 100 MG capsule Take 1 capsule (100 mg total) by mouth 2 (two) times daily. 09/28/15   Janne Napoleon, NP  HYDROcodone-acetaminophen (NORCO/VICODIN) 5-325 MG tablet Take 1 tablet by mouth every 4 (four) hours as needed. 09/28/15   Janne Napoleon, NP  ibuprofen (ADVIL,MOTRIN) 200 MG tablet Take 200 mg by mouth every 6 (six) hours as needed.    [provider]  metroNIDAZOLE (FLAGYL) 500 MG tablet One po bid x 14 days  DO NOT DRINK ALCOHOL WITH THIS MEDICATION 04/26/15   Zadie Rhine, MD  oxyCODONE-acetaminophen (PERCOCET/ROXICET) 5-325 MG tablet Take 1 tablet by mouth every 8 (eight) hours as needed for severe pain. 04/26/15   Zadie Rhine, MD  phenazopyridine (PYRIDIUM) 100 MG tablet Take 1 tablet (100 mg total) by mouth 3 (three) times daily as needed for pain. 03/07/19   Durward Parcel, FNP    Physical Exam: Constitutional: Moderately built and nourished. Vitals:   11/21/20 0315 11/21/20 0430 11/21/20 0500 11/21/20 0513  BP: (!) 108/58 130/77 116/78   Pulse: 92 92 89   Resp: 19 (!) 25 20   Temp:   98.3 F (36.8 C)   TempSrc:   Oral   SpO2: 95% 91% 92%   Weight:    90.7 kg  Height:    5\' 9"  (1.753 m)   Eyes: Anicteric no pallor. ENMT: No discharge from the ears eyes nose and mouth. Neck: No mass felt.  No neck rigidity. Respiratory: No rhonchi or crepitations. Cardiovascular: S1-S2 heard. Abdomen: No flank tenderness. Musculoskeletal: No edema. Skin: No rash. Neurologic: Alert awake oriented to time place and person.  Moves all extremities. Psychiatric: Appears normal.  Normal affect.   Labs on Admission: I have personally reviewed following labs and imaging studies  CBC: Recent Labs  Lab 11/20/20 1840  WBC 13.0*  NEUTROABS 11.1*  HGB 11.8*  HCT 37.8  MCV 81.6  PLT 280   Basic Metabolic Panel: Recent Labs  Lab 11/20/20 1840  NA 133*  K 3.8  CL 97*  CO2 26  GLUCOSE 138*  BUN 7  CREATININE 0.72  CALCIUM 9.1   GFR: Estimated Creatinine Clearance: 105.4 mL/min (by C-G formula based on SCr of 0.72 mg/dL). Liver Function Tests: Recent Labs  Lab 11/20/20 1840  AST 20  ALT 23  ALKPHOS 114  BILITOT 0.7  PROT 7.4  ALBUMIN 3.5   Recent Labs  Lab 11/20/20 1840  LIPASE 21   No results for input(s): AMMONIA in the last 168 hours. Coagulation Profile: Recent Labs  Lab 11/20/20 2320  INR 1.1    Cardiac Enzymes: No results for input(s): CKTOTAL, CKMB, CKMBINDEX, TROPONINI in the last 168 hours. BNP (last 3 results) No results for input(s): PROBNP in the last 8760 hours. HbA1C: No results for input(s): HGBA1C in the last 72 hours. CBG: No results for input(s): GLUCAP in the last 168 hours. Lipid Profile: No results for input(s): CHOL, HDL, LDLCALC, TRIG, CHOLHDL, LDLDIRECT in the last 72 hours. Thyroid Function Tests: No results for input(s): TSH, T4TOTAL, FREET4, T3FREE, THYROIDAB in the last 72 hours. Anemia Panel: No results for input(s): VITAMINB12, FOLATE, FERRITIN, TIBC, IRON, RETICCTPCT in the last 72 hours. Urine analysis:    Component Value Date/Time   COLORURINE AMBER (A) 11/20/2020 1628   APPEARANCEUR CLOUDY (A) 11/20/2020 1628   LABSPEC 1.020 11/20/2020 1628   PHURINE 5.0 11/20/2020 1628   GLUCOSEU NEGATIVE 11/20/2020 1628   HGBUR SMALL (A) 11/20/2020 1628   BILIRUBINUR NEGATIVE 11/20/2020 1628   BILIRUBINUR negative 03/07/2019 1533   KETONESUR NEGATIVE 11/20/2020 1628   PROTEINUR 30 (A) 11/20/2020 1628   UROBILINOGEN 2.0 (A) 03/07/2019 1533   NITRITE NEGATIVE 11/20/2020 1628   LEUKOCYTESUR LARGE (A) 11/20/2020 1628   Sepsis Labs: @LABRCNTIP (procalcitonin:4,lacticidven:4) ) Recent Results (from the past 240 hour(s))  Wet prep, genital     Status: None   Collection Time: 11/20/20 10:51 PM  Result Value Ref Range Status   Yeast Wet Prep HPF POC NONE SEEN NONE SEEN Final   Trich, Wet Prep NONE SEEN NONE SEEN Final   Clue Cells Wet Prep HPF POC NONE SEEN NONE SEEN Final   WBC, Wet Prep HPF POC NONE SEEN NONE SEEN Final   Sperm NONE SEEN  Final    Comment: Performed at [facility], [address], [place], [place]     Radiological Exams on Admission: CT Abdomen Pelvis W Contrast  Result Date: 11/21/2020 CLINICAL DATA:  Left lower quadrant abdominal pain x2 days, diarrhea, fever EXAM: CT ABDOMEN AND PELVIS WITH CONTRAST TECHNIQUE:  Multidetector CT imaging of the abdomen and pelvis was performed using the standard protocol following bolus administration of intravenous contrast. CONTRAST:  56mL OMNIPAQUE IOHEXOL 350 MG/ML SOLN COMPARISON:  06/26/2015 FINDINGS: Lower chest: Lung bases are clear. Hepatobiliary: Liver is within normal limits. Gallbladder is unremarkable. No intrahepatic or extrahepatic ductal dilatation. Pancreas: Within normal limits. Spleen: Within normal limits. Adrenals/Urinary Tract: Adrenal glands are within normal limits. On delayed imaging, there is mildly heterogeneous enhancement of the posterior left lower kidney (series 8/image 24), nonspecific but suggesting pyelonephritis. Kidneys are otherwise unremarkable. No hydronephrosis. Bladder is within normal limits. Stomach/Bowel: Stomach is within normal limits. No evidence of bowel obstruction. Prior appendectomy. No colonic wall thickening or inflammatory changes. Vascular/Lymphatic: No evidence of abdominal aortic aneurysm. Mild atherosclerotic calcifications  of the infrarenal abdominal aorta. No suspicious abdominopelvic lymphadenopathy. Reproductive: Uterus is within normal limits. Bilateral ovaries are within normal limits. Other: No abdominopelvic ascites. Musculoskeletal: Visualized osseous structures are within normal limits. IMPRESSION: Heterogeneous enhancement of the left lower kidney, nonspecific but suggesting pyelonephritis. Otherwise negative CT abdomen/pelvis. Electronically Signed   By: Charline Bills M.D.   On: 11/21/2020 03:56    EKG: Independently reviewed.  Sinus tachycardia.  Assessment/Plan Active Problems:   Pyelonephritis    Pyelonephritis admitted for IV antibiotics since patient had nausea vomiting and early signs of sepsis.  Follow cultures.  Continue hydration. Tobacco abuse advised about quitting.  COVID test is pending.   Since patient has pyelonephritis with intractable nausea vomiting will need inpatient status for IV  antibiotics.  Addendum -patient is COVID test came back positive.  Discussed with patient and about the test.  Patient agrees for remdesivir.  Patient is not hypoxic or short of breath.  Chest x-ray is pending.  We will follow inflammatory markers.  Closely monitor.  Patient has not been vaccinated.   DVT prophylaxis: Lovenox. Code Status: Full code. Family Communication: Gust with patient. Disposition Plan: Home. Consults called: None. Admission status: Inpatient.   Eduard Clos MD [facility] Pager [phone].  If 7PM-7AM, please contact night-coverage [email or web address] Password [facility]  11/21/2020, 5:24 AM

## 2020-11-22 LAB — CBC WITH DIFFERENTIAL/PLATELET
Abs Immature Granulocytes: 0.05 10*3/uL (ref 0.00–0.07)
Basophils Absolute: 0 10*3/uL (ref 0.0–0.1)
Basophils Relative: 0 %
Eosinophils Absolute: 0 10*3/uL (ref 0.0–0.5)
Eosinophils Relative: 0 %
HCT: 30.8 % — ABNORMAL LOW (ref 36.0–46.0)
Hemoglobin: 9.5 g/dL — ABNORMAL LOW (ref 12.0–15.0)
Immature Granulocytes: 1 %
Lymphocytes Relative: 15 %
Lymphs Abs: 1.2 10*3/uL (ref 0.7–4.0)
MCH: 25.1 pg — ABNORMAL LOW (ref 26.0–34.0)
MCHC: 30.8 g/dL (ref 30.0–36.0)
MCV: 81.3 fL (ref 80.0–100.0)
Monocytes Absolute: 0.6 10*3/uL (ref 0.1–1.0)
Monocytes Relative: 8 %
Neutro Abs: 6.1 10*3/uL (ref 1.7–7.7)
Neutrophils Relative %: 76 %
Platelets: 226 10*3/uL (ref 150–400)
RBC: 3.79 MIL/uL — ABNORMAL LOW (ref 3.87–5.11)
RDW: 16.2 % — ABNORMAL HIGH (ref 11.5–15.5)
WBC: 7.9 10*3/uL (ref 4.0–10.5)
nRBC: 0 % (ref 0.0–0.2)

## 2020-11-22 LAB — COMPREHENSIVE METABOLIC PANEL
ALT: 27 U/L (ref 0–44)
AST: 23 U/L (ref 15–41)
Albumin: 2.7 g/dL — ABNORMAL LOW (ref 3.5–5.0)
Alkaline Phosphatase: 90 U/L (ref 38–126)
Anion gap: 7 (ref 5–15)
BUN: 7 mg/dL (ref 6–20)
CO2: 28 mmol/L (ref 22–32)
Calcium: 8.4 mg/dL — ABNORMAL LOW (ref 8.9–10.3)
Chloride: 102 mmol/L (ref 98–111)
Creatinine, Ser: 0.58 mg/dL (ref 0.44–1.00)
GFR, Estimated: >60 mL/min (ref >60)
Glucose, Bld: 127 mg/dL — ABNORMAL HIGH (ref 70–99)
Potassium: 3.1 mmol/L — ABNORMAL LOW (ref 3.5–5.1)
Sodium: 137 mmol/L (ref 135–145)
Total Bilirubin: <0.1 mg/dL — ABNORMAL LOW (ref 0.3–1.2)
Total Protein: 6 g/dL — ABNORMAL LOW (ref 6.5–8.1)

## 2020-11-22 LAB — D-DIMER, QUANTITATIVE: D-Dimer, Quant: 0.76 ug/mL-FEU — ABNORMAL HIGH (ref 0.00–0.50)

## 2020-11-22 LAB — GC/CHLAMYDIA PROBE AMP (~~LOC~~) NOT AT ARMC
Chlamydia: NEGATIVE
Comment: NEGATIVE
Comment: NORMAL
Neisseria Gonorrhea: NEGATIVE

## 2020-11-22 LAB — C-REACTIVE PROTEIN: CRP: 19.5 mg/dL — ABNORMAL HIGH (ref <1.0)

## 2020-11-22 LAB — MAGNESIUM: Magnesium: 1.5 mg/dL — ABNORMAL LOW (ref 1.7–2.4)

## 2020-11-22 MED ORDER — POTASSIUM CHLORIDE CRYS ER 20 MEQ PO TBCR
40.0000 meq | EXTENDED_RELEASE_TABLET | ORAL | Status: AC
  Administered 2020-11-22 (×2): 40 meq via ORAL
  Filled 2020-11-22: qty 2

## 2020-11-22 MED ORDER — MAGNESIUM SULFATE 4 GM/100ML IV SOLN
4.0000 g | Freq: Once | INTRAVENOUS | Status: AC
  Administered 2020-11-22: 4 g via INTRAVENOUS
  Filled 2020-11-22: qty 100

## 2020-11-22 NOTE — Progress Notes (Addendum)
PROGRESS NOTE    Felicia ZACHERY  Livingston:[id number] DOB: [birth year] DOA: 11/20/2020 PCP: Oneita Hurt, No    Brief Narrative:  Felicia Livingston is a 45 year old female with no significant past medical history who presented to [facility] ED on 9/27 with left flank pain, nausea/vomiting, fever/chills, diaphoresis that has been present for the last 2 days.  Also reports dysuria.  In the ED, temperature 102.9 F, HR 123, RR 14, BP 132/83, SPO2 95% on room air.  Sodium 133, potassium 3.8, chloride 97, CO2 26, BUN 7, creatinine 0.72, glucose 138.  Lipase 21, AST 20, ALT 23, total bilirubin 0.7.  Lactic acid 1.6.  WBC 13.0, hemoglobin 11.8, platelets 280.  Wet prep negative.  Urinalysis with large leukocytes, negative nitrite, rare bacteria, greater than 50 WBCs.  CT abdomen/pelvis with contrast with heterogeneous enhancement left lower kidney suggestive of pyelonephritis.  Chest x-ray with no active disease process.  Cova-19 incidentally positive.  Duration consulted for further evaluation and management of sepsis secondary to left acute pyelonephritis.   Assessment & Plan:   Principal Problem:   Pyelonephritis Active Problems:   Acute lower UTI   COVID-19 virus infection   Acute pylonephritis, left Patient presenting with 2-day history of left flank pain, dysuria, nausea/vomiting.  Urinalysis with large leukocytes, negative nitrite, rare bacteria, greater than 50 WBCs.  CT abdomen/pelvis with contrast with heterogeneous enhancement left lower kidney suggestive of pyelonephritis. Hx of previous Klebsiella pneumonia UTI.  Urine culture with multiple species present. --Blood cultures x2: NG <12h --Ceftriaxone 2 g IV every 24 hours --Tramadol 50mg  PO q6h prn for moderate pain --Supportive care, antipyretics --Continue monitor fever curve --CBC daily  Hypokalemia Hypomagnesemia Potassium 3.1, magnesium 1.5 today, will replete. --Repeat electrolytes in the a.m.  COVID-19 viral infection, POA Incidentally found  on positive COVID-19 PCR on admission.  Patient is asymptomatic, denies shortness of breath.  Chest x-ray with no acute cardiopulmonary disease findings. --COVID test: + PCR 9/28 --CRP 23.4>19.5 --ddimer 0.61>0.76 --Remdesivir, plan 5-day course (Day #2/5) --Continue supportive care with albuterol MDI prn, vitamin C, zinc, Tylenol, antitussives (benzonatate/ Mucinex/Tussionex) --Follow CBC, CMP, D-dimer, ferritin, and CRP daily --Continue airborne/contact isolation precautions for 10 days weeks from the day of diagnosis  The treatment plan and use of medications and known side effects were discussed with patient/family. Some of the medications used are based on case reports/anecdotal data.  All other medications being used in the management of COVID-19 based on limited study data.  Complete risks and long-term side effects are unknown, however in the best clinical judgment they seem to be of some benefit.  Patient wanted to proceed with treatment options provided.   DVT prophylaxis: enoxaparin (LOVENOX) injection 40 mg Start: 11/21/20 0800   Code Status: Full Code Family Communication: No family present at bedside this morning  Disposition Plan:  Level of care: Telemetry Medical Status is: Inpatient  Remains inpatient appropriate because:Ongoing diagnostic testing needed not appropriate for outpatient work up, Unsafe d/c plan, IV treatments appropriate due to intensity of illness or inability to take PO, and Inpatient level of care appropriate due to severity of illness  Dispo: The patient is from: Home              Anticipated d/c is to: Home              Patient currently is not medically stable to d/c.   Difficult to place patient No   Consultants:  None  Procedures:  None  Antimicrobials:  Ceftriaxone  9/27>>   Subjective: Patient seen examined at bedside, resting comfortably, sleeping but easily arousable.  Continues with chills and body aches.  T-max past 24 hours 101.4  F.  Fever curve slowly improving.  Urine culture with multiple species, blood cultures with no growth less than 12 hours.  No other complaints or concerns at this time.  Denies headache, no dizziness, no night sweats, no chest pain, no palpitations, no shortness of breath, no abdominal pain, no weakness, no fatigue, no paresthesias.  No acute events overnight per nursing.  Objective: Vitals:   11/21/20 2033 11/21/20 2336 11/22/20 0648 11/22/20 0841  BP: 130/70 (!) 145/70 (!) 157/67 139/74  Pulse: 92 100 (!) 103 91  Resp: 18 18 19 18   Temp: 98.5 F (36.9 C) 100.1 F (37.8 C) 99.8 F (37.7 C) 98.7 F (37.1 C)  TempSrc: Oral Oral Oral Oral  SpO2: 97% 98% 97% 97%  Weight:      Height:        Intake/Output Summary (Last 24 hours) at 11/22/2020 1240 Last data filed at 11/22/2020 0404 Gross per 24 hour  Intake 1753.75 ml  Output --  Net 1753.75 ml   Filed Weights   11/21/20 0513  Weight: 90.7 kg    Examination:  General exam: Appears calm and comfortable  Respiratory system: Clear to auscultation. Respiratory effort normal.  On room air Cardiovascular system: S1 & S2 heard, RRR. No JVD, murmurs, rubs, gallops or clicks. No pedal edema. Gastrointestinal system: Abdomen is nondistended, soft and nontender. No organomegaly or masses felt. Normal bowel sounds heard. Central nervous system: Alert and oriented. No focal neurological deficits. Extremities: Symmetric 5 x 5 power. Skin: No rashes, lesions or ulcers Psychiatry: Judgement and insight appear normal. Mood & affect appropriate.     Data Reviewed: I have personally reviewed following labs and imaging studies  CBC: Recent Labs  Lab 11/20/20 1840 11/22/20 0343  WBC 13.0* 7.9  NEUTROABS 11.1* 6.1  HGB 11.8* 9.5*  HCT 37.8 30.8*  MCV 81.6 81.3  PLT 280 226   Basic Metabolic Panel: Recent Labs  Lab 11/20/20 1840 11/22/20 0343  NA 133* 137  K 3.8 3.1*  CL 97* 102  CO2 26 28  GLUCOSE 138* 127*  BUN 7 7   CREATININE 0.72 0.58  CALCIUM 9.1 8.4*  MG  --  1.5*   GFR: Estimated Creatinine Clearance: 105.4 mL/min (by C-G formula based on SCr of 0.58 mg/dL). Liver Function Tests: Recent Labs  Lab 11/20/20 1840 11/22/20 0343  AST 20 23  ALT 23 27  ALKPHOS 114 90  BILITOT 0.7 <0.1*  PROT 7.4 6.0*  ALBUMIN 3.5 2.7*   Recent Labs  Lab 11/20/20 1840  LIPASE 21   No results for input(s): AMMONIA in the last 168 hours. Coagulation Profile: Recent Labs  Lab 11/20/20 2320  INR 1.1   Cardiac Enzymes: No results for input(s): CKTOTAL, CKMB, CKMBINDEX, TROPONINI in the last 168 hours. BNP (last 3 results) No results for input(s): PROBNP in the last 8760 hours. HbA1C: No results for input(s): HGBA1C in the last 72 hours. CBG: No results for input(s): GLUCAP in the last 168 hours. Lipid Profile: No results for input(s): CHOL, HDL, LDLCALC, TRIG, CHOLHDL, LDLDIRECT in the last 72 hours. Thyroid Function Tests: No results for input(s): TSH, T4TOTAL, FREET4, T3FREE, THYROIDAB in the last 72 hours. Anemia Panel: No results for input(s): VITAMINB12, FOLATE, FERRITIN, TIBC, IRON, RETICCTPCT in the last 72 hours. Sepsis Labs: Recent Labs  Lab 11/20/20  2320 11/21/20 0051  LATICACIDVEN 1.6 1.4    Recent Results (from the past 240 hour(s))  Wet prep, genital     Status: None   Collection Time: 11/20/20 10:51 PM  Result Value Ref Range Status   Yeast Wet Prep HPF POC NONE SEEN NONE SEEN Final   Trich, Wet Prep NONE SEEN NONE SEEN Final   Clue Cells Wet Prep HPF POC NONE SEEN NONE SEEN Final   WBC, Wet Prep HPF POC NONE SEEN NONE SEEN Final   Sperm NONE SEEN  Final    Comment: Performed at [facility], [address], [place], [place] [address]  Blood culture (routine x 2)     Status: None (Preliminary result)   Collection Time: 11/20/20 11:20 PM   Specimen: BLOOD  Result Value Ref Range Status   Specimen Description BLOOD LEFT ANTECUBITAL  Final   Special Requests   Final     BOTTLES DRAWN AEROBIC AND ANAEROBIC Blood Culture results may not be optimal due to an inadequate volume of blood received in culture bottles   Culture   Final    NO GROWTH < 12 HOURS Performed at [facility], [address] [place], [place] [address]    Report Status PENDING  Incomplete  Blood culture (routine x 2)     Status: None (Preliminary result)   Collection Time: 11/20/20 11:30 PM   Specimen: BLOOD  Result Value Ref Range Status   Specimen Description BLOOD SITE NOT SPECIFIED  Final   Special Requests   Final    BOTTLES DRAWN AEROBIC AND ANAEROBIC Blood Culture adequate volume   Culture   Final    NO GROWTH < 12 HOURS Performed at [facility], [address], [place], [place] [address]    Report Status PENDING  Incomplete  Urine Culture     Status: Abnormal   Collection Time: 11/21/20  2:32 AM   Specimen: In/Out Cath Urine  Result Value Ref Range Status   Specimen Description IN/OUT CATH URINE  Final   Special Requests   Final    NONE Performed at [facility], [address] [place], [place] [address]    Culture MULTIPLE SPECIES PRESENT, SUGGEST RECOLLECTION (A)  Final   Report Status 11/21/2020 FINAL  Final  Resp Panel by RT-PCR (Flu A&B, Covid) Nasopharyngeal Swab     Status: Abnormal   Collection Time: 11/21/20  4:27 AM   Specimen: Nasopharyngeal Swab; Nasopharyngeal(NP) swabs in vial transport medium  Result Value Ref Range Status   SARS Coronavirus 2 by RT PCR POSITIVE (A) NEGATIVE Final    Comment: RESULT CALLED TO, READ BACK BY AND VERIFIED WITH: RN Malena Peer Thomas 11/21/20@6 :22 by TW (NOTE) SARS-CoV-2 target nucleic acids are DETECTED.  The SARS-CoV-2 RNA is generally detectable in upper respiratory specimens during the acute phase of infection. Positive results are indicative of the presence of the identified virus, but do not rule out bacterial infection or co-infection with other pathogens not detected by the test. Clinical  correlation with patient history and other diagnostic information is necessary to determine patient infection status. The expected result is Negative.  Fact Sheet for Patients: BloggerCourse.com  Fact Sheet for Healthcare Providers: SeriousBroker.it  This test is not yet approved or cleared by the [place] FDA and  has been authorized for detection and/or diagnosis of SARS-CoV-2 by FDA under an Emergency Use Authorization (EUA).  This EUA will remain in effect (meaning this test can  be used) for the duration of  the COVID-19 declaration under Section 564(b)(1) of the Act, 21 U.S.C. section 360bbb-3(b)(1), unless the authorization is terminated or revoked sooner.     Influenza A by PCR NEGATIVE NEGATIVE Final   Influenza B by PCR NEGATIVE NEGATIVE Final    Comment: (NOTE) The Xpert Xpress SARS-CoV-2/FLU/RSV plus assay is intended as an aid in the diagnosis of influenza from Nasopharyngeal swab specimens and should not be used as a sole basis for treatment. Nasal washings and aspirates are unacceptable for Xpert Xpress SARS-CoV-2/FLU/RSV testing.  Fact Sheet for Patients: BloggerCourse.com  Fact Sheet for Healthcare Providers: SeriousBroker.it  This test is not yet approved or cleared by the [place] FDA and has been authorized for detection and/or diagnosis of SARS-CoV-2 by FDA under an Emergency Use Authorization (EUA). This EUA will remain in effect (meaning this test can be used) for the duration of the COVID-19 declaration under Section 564(b)(1) of the Act, 21 U.S.C. section 360bbb-3(b)(1), unless the authorization is terminated or revoked.  Performed at [facility], [address], [place], [place] [address]          Radiology Studies: CT Abdomen Pelvis W Contrast  Result Date: 11/21/2020 CLINICAL DATA:  Left lower quadrant abdominal pain x2  days, diarrhea, fever EXAM: CT ABDOMEN AND PELVIS WITH CONTRAST TECHNIQUE: Multidetector CT imaging of the abdomen and pelvis was performed using the standard protocol following bolus administration of intravenous contrast. CONTRAST:  82mL OMNIPAQUE IOHEXOL 350 MG/ML SOLN COMPARISON:  06/26/2015 FINDINGS: Lower chest: Lung bases are clear. Hepatobiliary: Liver is within normal limits. Gallbladder is unremarkable. No intrahepatic or extrahepatic ductal dilatation. Pancreas: Within normal limits. Spleen: Within normal limits. Adrenals/Urinary Tract: Adrenal glands are within normal limits. On delayed imaging, there is mildly heterogeneous enhancement of the posterior left lower kidney (series 8/image 24), nonspecific but suggesting pyelonephritis. Kidneys are otherwise unremarkable. No hydronephrosis. Bladder is within normal limits. Stomach/Bowel: Stomach is within normal limits. No evidence of bowel obstruction. Prior appendectomy. No colonic wall thickening or inflammatory changes. Vascular/Lymphatic: No evidence of abdominal aortic aneurysm. Mild atherosclerotic calcifications of the infrarenal abdominal aorta. No suspicious abdominopelvic lymphadenopathy. Reproductive: Uterus is within normal limits. Bilateral ovaries are within normal limits. Other: No abdominopelvic ascites. Musculoskeletal: Visualized osseous structures are within normal limits. IMPRESSION: Heterogeneous enhancement of the left lower kidney, nonspecific but suggesting pyelonephritis. Otherwise negative CT abdomen/pelvis. Electronically Signed   By: Charline Bills M.D.   On: 11/21/2020 03:56   DG CHEST PORT 1 VIEW  Result Date: 11/21/2020 CLINICAL DATA:  COVID positive with kidney infection EXAM: PORTABLE CHEST 1 VIEW COMPARISON:  Abdominal CT from earlier the same day FINDINGS: Normal heart size and mediastinal contours. No acute infiltrate or edema. Asymmetric density of the chest attributed to rotation and breast positioning. No  effusion or pneumothorax. No acute osseous findings. IMPRESSION: No active disease. Electronically Signed   By: Tiburcio Pea M.D.   On: 11/21/2020 07:20        Scheduled Meds:  enoxaparin (LOVENOX) injection  40 mg Subcutaneous Q24H   Continuous Infusions:  cefTRIAXone (ROCEPHIN)  IV 2 g (11/21/20 1757)   remdesivir 100 mg in NS 100 mL 100 mg (11/22/20 1036)     LOS: 1 day    Time spent: 39 minutes spent on chart review, discussion with nursing staff, consultants, updating family and interview/physical exam; more than 50% of that time was spent in counseling and/or coordination of care.    Alvira Philips [place],  DO [facility] Available via Epic secure chat 7am-7pm After these hours, please refer to coverage provider listed on [email or web address] 11/22/2020, 12:40 PM

## 2020-11-23 LAB — MAGNESIUM: Magnesium: 2.1 mg/dL (ref 1.7–2.4)

## 2020-11-23 LAB — C-REACTIVE PROTEIN: CRP: 19.3 mg/dL — ABNORMAL HIGH (ref <1.0)

## 2020-11-23 LAB — COMPREHENSIVE METABOLIC PANEL
ALT: 32 U/L (ref 0–44)
AST: 23 U/L (ref 15–41)
Albumin: 3 g/dL — ABNORMAL LOW (ref 3.5–5.0)
Alkaline Phosphatase: 95 U/L (ref 38–126)
Anion gap: 10 (ref 5–15)
BUN: 6 mg/dL (ref 6–20)
CO2: 25 mmol/L (ref 22–32)
Calcium: 8.3 mg/dL — ABNORMAL LOW (ref 8.9–10.3)
Chloride: 101 mmol/L (ref 98–111)
Creatinine, Ser: 0.6 mg/dL (ref 0.44–1.00)
GFR, Estimated: >60 mL/min (ref >60)
Glucose, Bld: 123 mg/dL — ABNORMAL HIGH (ref 70–99)
Potassium: 3.4 mmol/L — ABNORMAL LOW (ref 3.5–5.1)
Sodium: 136 mmol/L (ref 135–145)
Total Bilirubin: 0.6 mg/dL (ref 0.3–1.2)
Total Protein: 6.6 g/dL (ref 6.5–8.1)

## 2020-11-23 LAB — CBC WITH DIFFERENTIAL/PLATELET
Abs Immature Granulocytes: 0.06 10*3/uL (ref 0.00–0.07)
Basophils Absolute: 0 10*3/uL (ref 0.0–0.1)
Basophils Relative: 1 %
Eosinophils Absolute: 0 10*3/uL (ref 0.0–0.5)
Eosinophils Relative: 1 %
HCT: 32.5 % — ABNORMAL LOW (ref 36.0–46.0)
Hemoglobin: 10.3 g/dL — ABNORMAL LOW (ref 12.0–15.0)
Immature Granulocytes: 1 %
Lymphocytes Relative: 15 %
Lymphs Abs: 1 10*3/uL (ref 0.7–4.0)
MCH: 25.4 pg — ABNORMAL LOW (ref 26.0–34.0)
MCHC: 31.7 g/dL (ref 30.0–36.0)
MCV: 80.2 fL (ref 80.0–100.0)
Monocytes Absolute: 0.6 10*3/uL (ref 0.1–1.0)
Monocytes Relative: 9 %
Neutro Abs: 4.9 10*3/uL (ref 1.7–7.7)
Neutrophils Relative %: 73 %
Platelets: 237 10*3/uL (ref 150–400)
RBC: 4.05 MIL/uL (ref 3.87–5.11)
RDW: 16.1 % — ABNORMAL HIGH (ref 11.5–15.5)
WBC: 6.6 10*3/uL (ref 4.0–10.5)
nRBC: 0 % (ref 0.0–0.2)

## 2020-11-23 LAB — D-DIMER, QUANTITATIVE: D-Dimer, Quant: 0.95 ug/mL-FEU — ABNORMAL HIGH (ref 0.00–0.50)

## 2020-11-23 MED ORDER — CEFDINIR 300 MG PO CAPS: 300.0000 mg | ORAL_CAPSULE | Freq: Two times a day (BID) | ORAL | 0 refills | Status: AC

## 2020-11-23 MED ORDER — TRAMADOL HCL 50 MG PO TABS: 50.0000 mg | ORAL_TABLET | Freq: Four times a day (QID) | ORAL | 0 refills | Status: AC | PRN

## 2020-11-23 NOTE — Discharge Summary (Signed)
Physician Discharge Summary  Felicia Livingston ZOX:[id number] DOB: 08-19-1975 DOA: 11/20/2020  PCP: Oneita Hurt, No  Admit date: 11/20/2020 Discharge date: 11/23/2020  Admitted From: Home Disposition: Home  Recommendations for Outpatient Follow-up:  Follow up with PCP in 1-2 weeks Continue cefdinir 300 mg p.o. twice daily to complete 14-day course for pyelonephritis Tramadol as needed for moderate pain Follow-up finalized blood cultures which are pending at time of discharge  Home Health: No Equipment/Devices: None  Discharge Condition: Stable CODE STATUS: Full code Diet recommendation: Regular diet  History of present illness:  Felicia Livingston is a 45 year old female with no significant past medical history who presented to [facility] ED on 9/27 with left flank pain, nausea/vomiting, fever/chills, diaphoresis that has been present for the last 2 days.  Also reports dysuria.   In the ED, temperature 102.9 F, HR 123, RR 14, BP 132/83, SPO2 95% on room air.  Sodium 133, potassium 3.8, chloride 97, CO2 26, BUN 7, creatinine 0.72, glucose 138.  Lipase 21, AST 20, ALT 23, total bilirubin 0.7.  Lactic acid 1.6.  WBC 13.0, hemoglobin 11.8, platelets 280.  Wet prep negative.  Urinalysis with large leukocytes, negative nitrite, rare bacteria, greater than 50 WBCs.  CT abdomen/pelvis with contrast with heterogeneous enhancement left lower kidney suggestive of pyelonephritis.  Chest x-ray with no active disease process.  Covid-19 incidentally positive.  Duration consulted for further evaluation and management of sepsis secondary to left acute pyelonephritis.  Hospital course:  Acute pylonephritis, left Patient presenting with 2-day history of left flank pain, dysuria, nausea/vomiting.  Urinalysis with large leukocytes, negative nitrite, rare bacteria, greater than 50 WBCs.  CT abdomen/pelvis with contrast with heterogeneous enhancement left lower kidney suggestive of pyelonephritis. Hx of previous Klebsiella  pneumonia UTI.  Urine culture with multiple species present.  Blood culture showed no growth during hospitalization but finalized cultures were pending at time of discharge.  Patient was initially started on ceftriaxone with resolution of fever for the past 24 hours and will transition to cefdinir 300 mg p.o. twice daily to complete 14-day course.  Continue Tylenol as needed for fever/mild pain and tramadol as needed for moderate pain.  Outpatient follow-up with PCP.   Hypokalemia Hypomagnesemia Repleted during hospitalization.   COVID-19 viral infection, POA Incidentally found on positive COVID-19 PCR on admission.  Patient is asymptomatic, denies shortness of breath.  Chest x-ray with no acute cardiopulmonary disease findings. COVID test: + PCR 9/28. CRP 23.4, ddimer 0.61.  Patient oxygenating well on room air.  Patient completed 3 doses of IV remdesivir while inpatient.  Continue isolation/quarantine per CDC guidelines.  The treatment plan and use of medications and known side effects were discussed with patient/family. Some of the medications used are based on case reports/anecdotal data.  All other medications being used in the management of COVID-19 based on limited study data.  Complete risks and long-term side effects are unknown, however in the best clinical judgment they seem to be of some benefit.  Patient wanted to proceed with treatment options provided.  Discharge Diagnoses:  Principal Problem:   Pyelonephritis Active Problems:   Acute lower UTI   COVID-19 virus infection    Discharge Instructions  Discharge Instructions     Call MD for:  difficulty breathing, headache or visual disturbances   Complete by: As directed    Call MD for:  extreme fatigue   Complete by: As directed    Call MD for:  persistant dizziness or light-headedness   Complete by: As directed  Call MD for:  persistant nausea and vomiting   Complete by: As directed    Call MD for:  severe uncontrolled  pain   Complete by: As directed    Call MD for:  temperature >100.4   Complete by: As directed    Diet - low sodium heart healthy   Complete by: As directed    Increase activity slowly   Complete by: As directed       Allergies as of 11/23/2020       Reactions   Bee Venom    Lidocaine    Sulfa Antibiotics    Iohexol Hives   Pt. Had CT scan at [facility] and had hives after scan.           Medication List     STOP taking these medications    clindamycin 300 MG capsule Commonly known as: Cleocin       TAKE these medications    cefdinir 300 MG capsule Commonly known as: OMNICEF Take 1 capsule (300 mg total) by mouth 2 (two) times daily for 12 days.   ibuprofen 200 MG tablet Commonly known as: ADVIL Take 400 mg by mouth every 6 (six) hours as needed for mild pain.   multivitamin tablet Take 1 tablet by mouth daily.   traMADol 50 MG tablet Commonly known as: ULTRAM Take 1 tablet (50 mg total) by mouth every 6 (six) hours as needed for up to 5 days for moderate pain.        Follow-up Information     [facility] Schedule an appointment as soon as possible for a visit.   Contact information: [address] [place] [address] [phone]               Allergies  Allergen Reactions   Bee Venom    Lidocaine    Sulfa Antibiotics    Iohexol Hives    Pt. Had CT scan at [facility] and had hives after scan.       Consultations: None   Procedures/Studies: CT Abdomen Pelvis W Contrast  Result Date: 11/21/2020 CLINICAL DATA:  Left lower quadrant abdominal pain x2 days, diarrhea, fever EXAM: CT ABDOMEN AND PELVIS WITH CONTRAST TECHNIQUE: Multidetector CT imaging of the abdomen and pelvis was performed using the standard protocol following bolus administration of intravenous contrast. CONTRAST:  47mL OMNIPAQUE IOHEXOL 350 MG/ML SOLN COMPARISON:  06/26/2015 FINDINGS: Lower chest: Lung bases are clear.  Hepatobiliary: Liver is within normal limits. Gallbladder is unremarkable. No intrahepatic or extrahepatic ductal dilatation. Pancreas: Within normal limits. Spleen: Within normal limits. Adrenals/Urinary Tract: Adrenal glands are within normal limits. On delayed imaging, there is mildly heterogeneous enhancement of the posterior left lower kidney (series 8/image 24), nonspecific but suggesting pyelonephritis. Kidneys are otherwise unremarkable. No hydronephrosis. Bladder is within normal limits. Stomach/Bowel: Stomach is within normal limits. No evidence of bowel obstruction. Prior appendectomy. No colonic wall thickening or inflammatory changes. Vascular/Lymphatic: No evidence of abdominal aortic aneurysm. Mild atherosclerotic calcifications of the infrarenal abdominal aorta. No suspicious abdominopelvic lymphadenopathy. Reproductive: Uterus is within normal limits. Bilateral ovaries are within normal limits. Other: No abdominopelvic ascites. Musculoskeletal: Visualized osseous structures are within normal limits. IMPRESSION: Heterogeneous enhancement of the left lower kidney, nonspecific but suggesting pyelonephritis. Otherwise negative CT abdomen/pelvis. Electronically Signed   By: Charline Bills M.D.   On: 11/21/2020 03:56   DG CHEST PORT 1 VIEW  Result Date: 11/21/2020 CLINICAL DATA:  COVID positive with kidney  infection EXAM: PORTABLE CHEST 1 VIEW COMPARISON:  Abdominal CT from earlier the same day FINDINGS: Normal heart size and mediastinal contours. No acute infiltrate or edema. Asymmetric density of the chest attributed to rotation and breast positioning. No effusion or pneumothorax. No acute osseous findings. IMPRESSION: No active disease. Electronically Signed   By: Tiburcio Pea M.D.   On: 11/21/2020 07:20     Subjective: Patient seen examined bedside, resting comfortably.  No complaints this morning.  Ready for discharge home.  Has been afebrile for the past 24 hours or greater.   Transitioning to cefdinir to complete 14-day course for pyelonephritis.  Denies headache, no dizziness, no chest pain, no palpitations, no fever/chills/night sweats, no nausea/vomiting/diarrhea, no abdominal pain, no weakness, no fatigue, no paresthesias.  No acute events overnight per nurse staff.  Discharge Exam: Vitals:   11/22/20 2011 11/23/20 0418  BP: (!) 148/68 (!) 144/76  Pulse: 99 86  Resp: 18 17  Temp: 99.4 F (37.4 C) 98.8 F (37.1 C)  SpO2: 99% 97%   Vitals:   11/22/20 0841 11/22/20 1655 11/22/20 2011 11/23/20 0418  BP: 139/74 130/64 (!) 148/68 (!) 144/76  Pulse: 91 (!) 102 99 86  Resp: 18 18 18 17   Temp: 98.7 F (37.1 C) 98.6 F (37 C) 99.4 F (37.4 C) 98.8 F (37.1 C)  TempSrc: Oral Oral Oral Oral  SpO2: 97% 99% 99% 97%  Weight:      Height:        General: Pt is alert, awake, not in acute distress Cardiovascular: RRR, S1/S2 +, no rubs, no gallops Respiratory: CTA bilaterally, no wheezing, no rhonchi, on room air Abdominal: Soft, NT, ND, bowel sounds + Extremities: no edema, no cyanosis    The results of significant diagnostics from this hospitalization (including imaging, microbiology, ancillary and laboratory) are listed below for reference.     Microbiology: Recent Results (from the past 240 hour(s))  Wet prep, genital     Status: None   Collection Time: 11/20/20 10:51 PM  Result Value Ref Range Status   Yeast Wet Prep HPF POC NONE SEEN NONE SEEN Final   Trich, Wet Prep NONE SEEN NONE SEEN Final   Clue Cells Wet Prep HPF POC NONE SEEN NONE SEEN Final   WBC, Wet Prep HPF POC NONE SEEN NONE SEEN Final   Sperm NONE SEEN  Final    Comment: Performed at [facility], [address], [place], [place]  Blood culture (routine x 2)     Status: None (Preliminary result)   Collection Time: 11/20/20 11:20 PM   Specimen: BLOOD  Result Value Ref Range Status   Specimen Description BLOOD LEFT ANTECUBITAL  Final   Special Requests   Final     BOTTLES DRAWN AEROBIC AND ANAEROBIC Blood Culture results may not be optimal due to an inadequate volume of blood received in culture bottles   Culture   Final    NO GROWTH 1 DAY Performed at [facility], [address], [place], [place]    Report Status PENDING  Incomplete  Blood culture (routine x 2)     Status: None (Preliminary result)   Collection Time: 11/20/20 11:30 PM   Specimen: BLOOD  Result Value Ref Range Status   Specimen Description BLOOD SITE NOT SPECIFIED  Final   Special Requests   Final    BOTTLES DRAWN AEROBIC AND ANAEROBIC Blood Culture adequate volume   Culture   Final  NO GROWTH 1 DAY Performed at [facility], [address] [place], [place] [address]    Report Status PENDING  Incomplete  Urine Culture     Status: Abnormal   Collection Time: 11/21/20  2:32 AM   Specimen: In/Out Cath Urine  Result Value Ref Range Status   Specimen Description IN/OUT CATH URINE  Final   Special Requests   Final    NONE Performed at [facility], [address], [place], [place] [address]    Culture MULTIPLE SPECIES PRESENT, SUGGEST RECOLLECTION (A)  Final   Report Status 11/21/2020 FINAL  Final  Resp Panel by RT-PCR (Flu A&B, Covid) Nasopharyngeal Swab     Status: Abnormal   Collection Time: 11/21/20  4:27 AM   Specimen: Nasopharyngeal Swab; Nasopharyngeal(NP) swabs in vial transport medium  Result Value Ref Range Status   SARS Coronavirus 2 by RT PCR POSITIVE (A) NEGATIVE Final    Comment: RESULT CALLED TO, READ BACK BY AND VERIFIED WITH: RN Deirdre Priest 11/21/20@6 :22 by TW (NOTE) SARS-CoV-2 target nucleic acids are DETECTED.  The SARS-CoV-2 RNA is generally detectable in upper respiratory specimens during the acute phase of infection. Positive results are indicative of the presence of the identified virus, but do not rule out bacterial infection or co-infection with other pathogens not detected by the test. Clinical correlation with  patient history and other diagnostic information is necessary to determine patient infection status. The expected result is Negative.  Fact Sheet for Patients: BloggerCourse.com  Fact Sheet for Healthcare Providers: SeriousBroker.it  This test is not yet approved or cleared by the [place] FDA and  has been authorized for detection and/or diagnosis of SARS-CoV-2 by FDA under an Emergency Use Authorization (EUA).  This EUA will remain in effect (meaning this test can  be used) for the duration of  the COVID-19 declaration under Section 564(b)(1) of the Act, 21 U.S.C. section 360bbb-3(b)(1), unless the authorization is terminated or revoked sooner.     Influenza A by PCR NEGATIVE NEGATIVE Final   Influenza B by PCR NEGATIVE NEGATIVE Final    Comment: (NOTE) The Xpert Xpress SARS-CoV-2/FLU/RSV plus assay is intended as an aid in the diagnosis of influenza from Nasopharyngeal swab specimens and should not be used as a sole basis for treatment. Nasal washings and aspirates are unacceptable for Xpert Xpress SARS-CoV-2/FLU/RSV testing.  Fact Sheet for Patients: BloggerCourse.com  Fact Sheet for Healthcare Providers: SeriousBroker.it  This test is not yet approved or cleared by the [place] FDA and has been authorized for detection and/or diagnosis of SARS-CoV-2 by FDA under an Emergency Use Authorization (EUA). This EUA will remain in effect (meaning this test can be used) for the duration of the COVID-19 declaration under Section 564(b)(1) of the Act, 21 U.S.C. section 360bbb-3(b)(1), unless the authorization is terminated or revoked.  Performed at [facility], [address] [place], [place] [address]      Labs: BNP (last 3 results) No results for input(s): BNP in the last 8760 hours. Basic Metabolic Panel: Recent Labs  Lab 11/20/20 1840 11/22/20 0343  11/23/20 0127  NA 133* 137 136  K 3.8 3.1* 3.4*  CL 97* 102 101  CO2 26 28 25   GLUCOSE 138* 127* 123*  BUN 7 7 6   CREATININE 0.72 0.58 0.60  CALCIUM 9.1 8.4* 8.3*  MG  --  1.5* 2.1   Liver Function Tests: Recent Labs  Lab 11/20/20 1840 11/22/20 0343 11/23/20 0127  AST 20  23 23  ALT 23 27 32  ALKPHOS 114 90 95  BILITOT 0.7 <0.1* 0.6  PROT 7.4 6.0* 6.6  ALBUMIN 3.5 2.7* 3.0*   Recent Labs  Lab 11/20/20 1840  LIPASE 21   No results for input(s): AMMONIA in the last 168 hours. CBC: Recent Labs  Lab 11/20/20 1840 11/22/20 0343 11/23/20 0127  WBC 13.0* 7.9 6.6  NEUTROABS 11.1* 6.1 4.9  HGB 11.8* 9.5* 10.3*  HCT 37.8 30.8* 32.5*  MCV 81.6 81.3 80.2  PLT 280 226 237   Cardiac Enzymes: No results for input(s): CKTOTAL, CKMB, CKMBINDEX, TROPONINI in the last 168 hours. BNP: Invalid input(s): POCBNP CBG: No results for input(s): GLUCAP in the last 168 hours. D-Dimer Recent Labs    11/22/20 0343 11/23/20 0127  DDIMER 0.76* 0.95*   Hgb A1c No results for input(s): HGBA1C in the last 72 hours. Lipid Profile No results for input(s): CHOL, HDL, LDLCALC, TRIG, CHOLHDL, LDLDIRECT in the last 72 hours. Thyroid function studies No results for input(s): TSH, T4TOTAL, T3FREE, THYROIDAB in the last 72 hours.  Invalid input(s): FREET3 Anemia work up No results for input(s): VITAMINB12, FOLATE, FERRITIN, TIBC, IRON, RETICCTPCT in the last 72 hours. Urinalysis    Component Value Date/Time   COLORURINE AMBER (A) 11/20/2020 1628   APPEARANCEUR CLOUDY (A) 11/20/2020 1628   LABSPEC 1.020 11/20/2020 1628   PHURINE 5.0 11/20/2020 1628   GLUCOSEU NEGATIVE 11/20/2020 1628   HGBUR SMALL (A) 11/20/2020 1628   BILIRUBINUR NEGATIVE 11/20/2020 1628   BILIRUBINUR negative 03/07/2019 1533   KETONESUR NEGATIVE 11/20/2020 1628   PROTEINUR 30 (A) 11/20/2020 1628   UROBILINOGEN 2.0 (A) 03/07/2019 1533   NITRITE NEGATIVE 11/20/2020 1628   LEUKOCYTESUR LARGE (A) 11/20/2020 1628    Sepsis Labs Invalid input(s): PROCALCITONIN,  WBC,  LACTICIDVEN Microbiology Recent Results (from the past 240 hour(s))  Wet prep, genital     Status: None   Collection Time: 11/20/20 10:51 PM  Result Value Ref Range Status   Yeast Wet Prep HPF POC NONE SEEN NONE SEEN Final   Trich, Wet Prep NONE SEEN NONE SEEN Final   Clue Cells Wet Prep HPF POC NONE SEEN NONE SEEN Final   WBC, Wet Prep HPF POC NONE SEEN NONE SEEN Final   Sperm NONE SEEN  Final    Comment: Performed at [facility], [address] [place], [place] [address]  Blood culture (routine x 2)     Status: None (Preliminary result)   Collection Time: 11/20/20 11:20 PM   Specimen: BLOOD  Result Value Ref Range Status   Specimen Description BLOOD LEFT ANTECUBITAL  Final   Special Requests   Final    BOTTLES DRAWN AEROBIC AND ANAEROBIC Blood Culture results may not be optimal due to an inadequate volume of blood received in culture bottles   Culture   Final    NO GROWTH 1 DAY Performed at [facility], [address] [place], [place] [address]    Report Status PENDING  Incomplete  Blood culture (routine x 2)     Status: None (Preliminary result)   Collection Time: 11/20/20 11:30 PM   Specimen: BLOOD  Result Value Ref Range Status   Specimen Description BLOOD SITE NOT SPECIFIED  Final   Special Requests   Final    BOTTLES DRAWN AEROBIC AND ANAEROBIC Blood Culture adequate volume   Culture   Final    NO GROWTH 1 DAY Performed at [facility], [address], [place], [place] [address]    Report Status PENDING  Incomplete  Urine Culture     Status: Abnormal   Collection Time: 11/21/20  2:32 AM   Specimen: In/Out Cath Urine  Result Value Ref Range Status   Specimen Description IN/OUT CATH URINE  Final   Special Requests   Final    NONE Performed at [facility], [address], [place], [place] [address]    Culture MULTIPLE SPECIES PRESENT, SUGGEST RECOLLECTION (A)  Final   Report Status  11/21/2020 FINAL  Final  Resp Panel by RT-PCR (Flu A&B, Covid) Nasopharyngeal Swab     Status: Abnormal   Collection Time: 11/21/20  4:27 AM   Specimen: Nasopharyngeal Swab; Nasopharyngeal(NP) swabs in vial transport medium  Result Value Ref Range Status   SARS Coronavirus 2 by RT PCR POSITIVE (A) NEGATIVE Final    Comment: RESULT CALLED TO, READ BACK BY AND VERIFIED WITH: RN Deirdre Priest 11/21/20@6 :22 by TW (NOTE) SARS-CoV-2 target nucleic acids are DETECTED.  The SARS-CoV-2 RNA is generally detectable in upper respiratory specimens during the acute phase of infection. Positive results are indicative of the presence of the identified virus, but do not rule out bacterial infection or co-infection with other pathogens not detected by the test. Clinical correlation with patient history and other diagnostic information is necessary to determine patient infection status. The expected result is Negative.  Fact Sheet for Patients: BloggerCourse.com  Fact Sheet for Healthcare Providers: SeriousBroker.it  This test is not yet approved or cleared by the [place] FDA and  has been authorized for detection and/or diagnosis of SARS-CoV-2 by FDA under an Emergency Use Authorization (EUA).  This EUA will remain in effect (meaning this test can  be used) for the duration of  the COVID-19 declaration under Section 564(b)(1) of the Act, 21 U.S.C. section 360bbb-3(b)(1), unless the authorization is terminated or revoked sooner.     Influenza A by PCR NEGATIVE NEGATIVE Final   Influenza B by PCR NEGATIVE NEGATIVE Final    Comment: (NOTE) The Xpert Xpress SARS-CoV-2/FLU/RSV plus assay is intended as an aid in the diagnosis of influenza from Nasopharyngeal swab specimens and should not be used as a sole basis for treatment. Nasal washings and aspirates are unacceptable for Xpert Xpress SARS-CoV-2/FLU/RSV testing.  Fact Sheet for  Patients: BloggerCourse.com  Fact Sheet for Healthcare Providers: SeriousBroker.it  This test is not yet approved or cleared by the [place] FDA and has been authorized for detection and/or diagnosis of SARS-CoV-2 by FDA under an Emergency Use Authorization (EUA). This EUA will remain in effect (meaning this test can be used) for the duration of the COVID-19 declaration under Section 564(b)(1) of the Act, 21 U.S.C. section 360bbb-3(b)(1), unless the authorization is terminated or revoked.  Performed at [facility], [address], [place], [place] [address]      Time coordinating discharge: Over 30 minutes  SIGNED:   Alvira Philips [place], DO  [facility] 11/23/2020, 7:54 AM

## 2020-11-23 NOTE — Progress Notes (Signed)
Nursing Discharge note  Patient alert and oriented. VSS. Denies pain or Sob. Dc instructions reviewed with patient >verbalized understanding.All belongings given to patient prior to dc

## 2020-11-26 LAB — CULTURE, BLOOD (ROUTINE X 2)
Culture: NO GROWTH
Culture: NO GROWTH
Special Requests: ADEQUATE

## 2022-10-17 IMAGING — CT CT ABD-PELV W/ CM
2 of 5 series · 16 of 46 positions shown, 18 images · IV contrast (omnipaque)
Comparison: 06/26/2015

CLINICAL DATA: Left lower quadrant abdominal pain x2 days,
diarrhea, fever

EXAM:
CT ABDOMEN AND PELVIS WITH CONTRAST
TECHNIQUE: Multidetector CT imaging of the abdomen and pelvis was performed
using the standard protocol following bolus administration of
intravenous contrast.
CONTRAST:  50mL OMNIPAQUE IOHEXOL 350 MG/ML SOLN

[Series 3: abdomen 5.0 · axial · 0.94mm/px · z∈[-872,-477]mm · 13 of 91 slices shown, 15 images]
[im 6/91  soft-tissue]
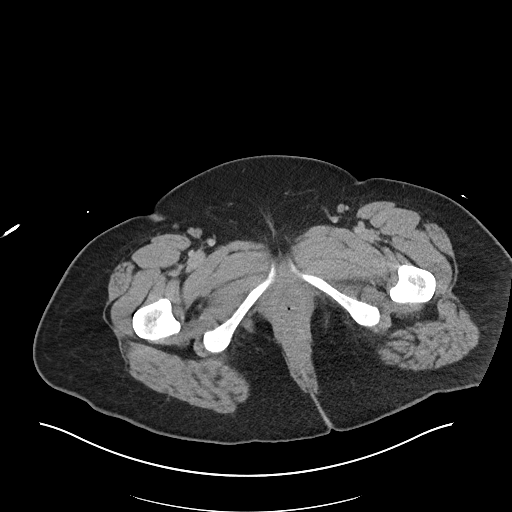
[im 6/91  bone]
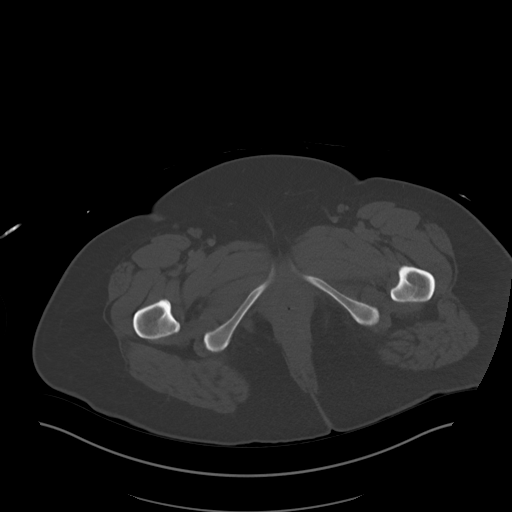
[im 12/91  soft-tissue]
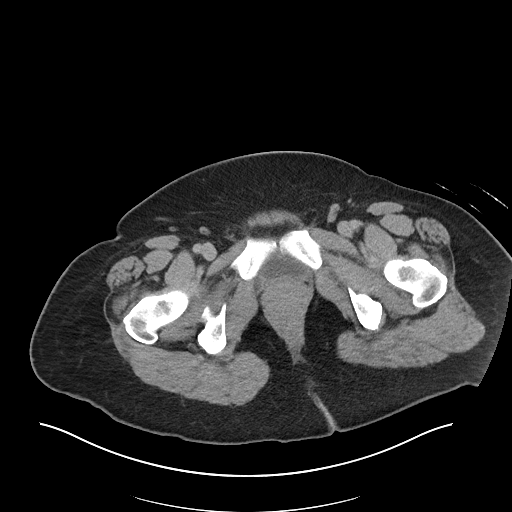
[im 17/91  soft-tissue]
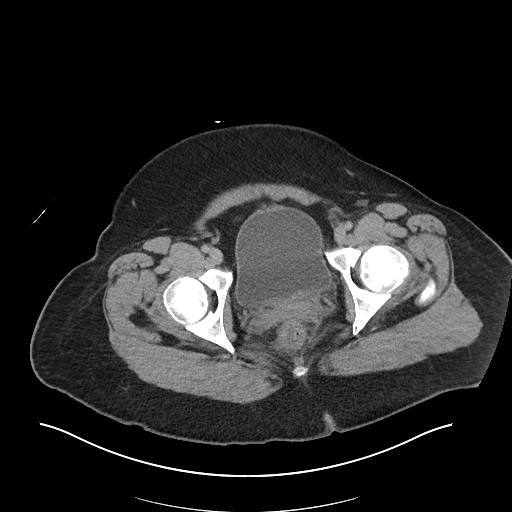
[im 29/91  soft-tissue]
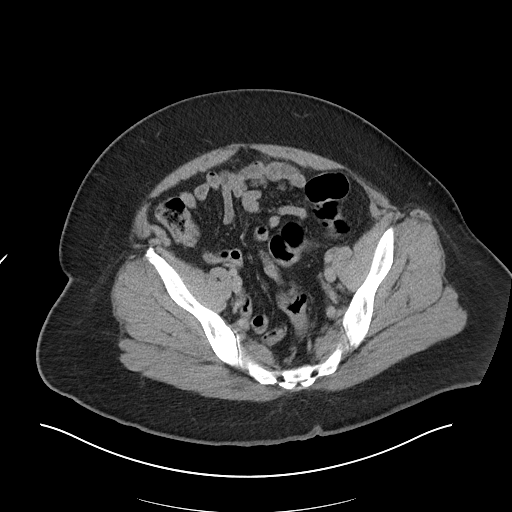
[im 34/91  soft-tissue]
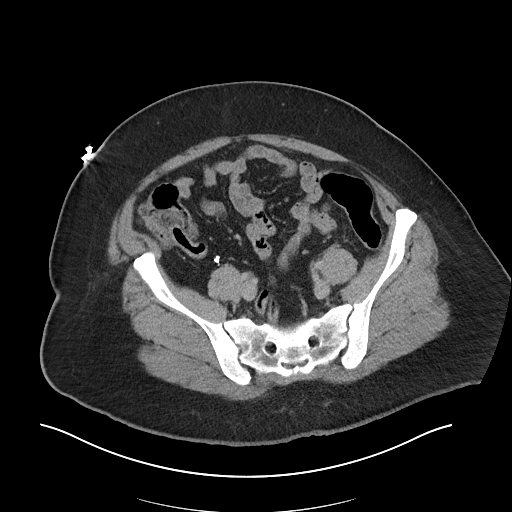
[im 40/91  soft-tissue]
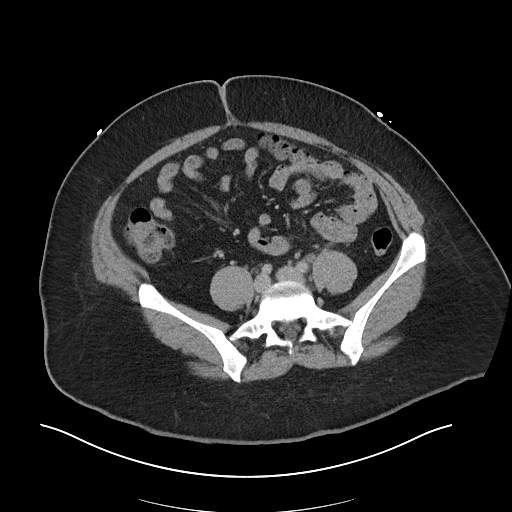
[im 46/91  soft-tissue]
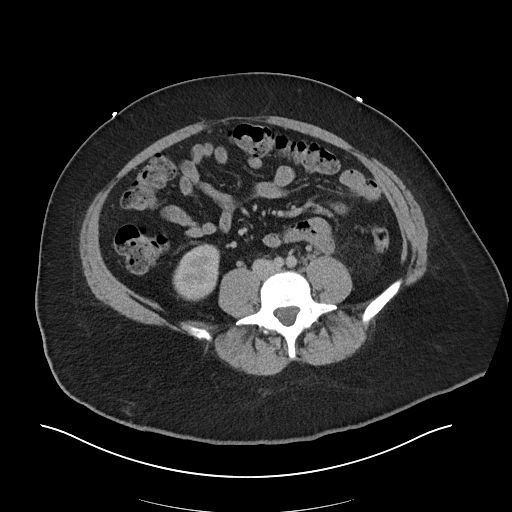
[im 51/91  soft-tissue]
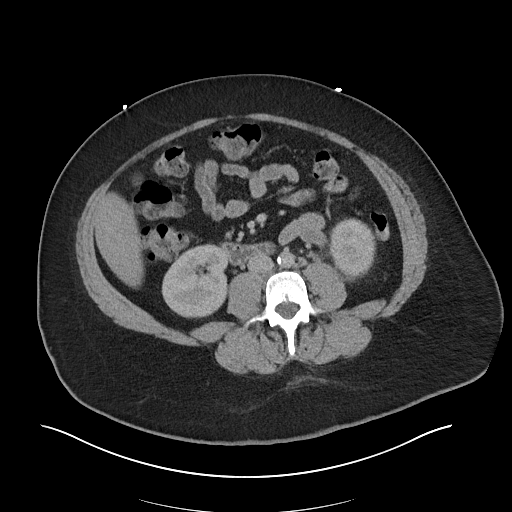
[im 57/91  soft-tissue]
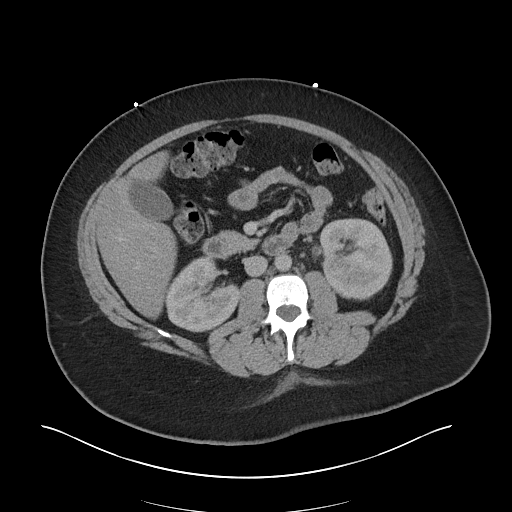
[im 57/91  bone]
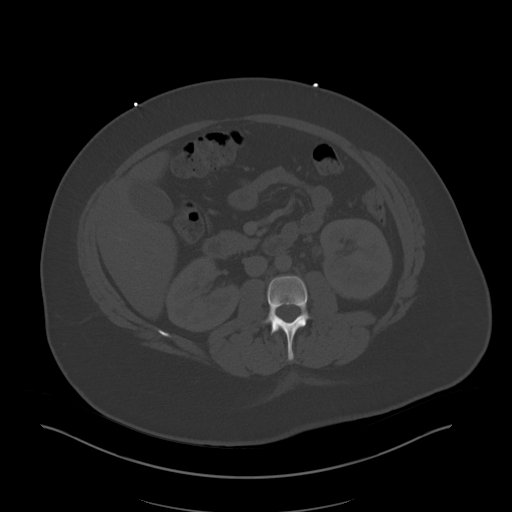
[im 62/91  soft-tissue]
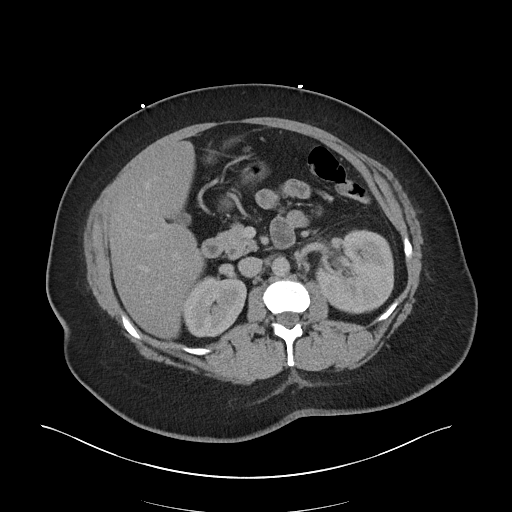
[im 74/91  soft-tissue]
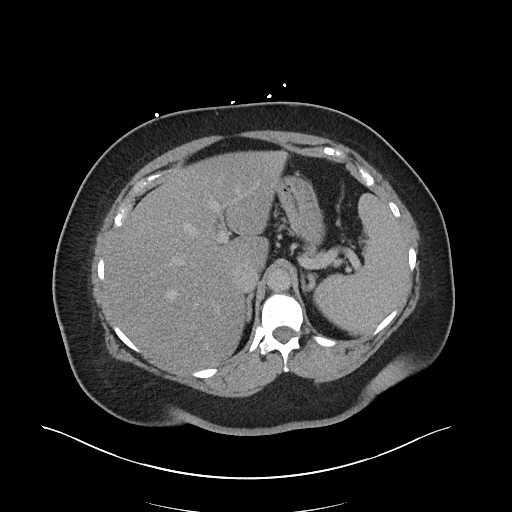
[im 79/91  soft-tissue]
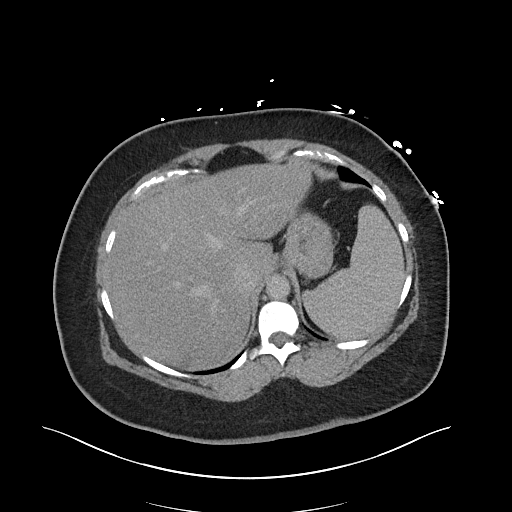
[im 85/91  soft-tissue]
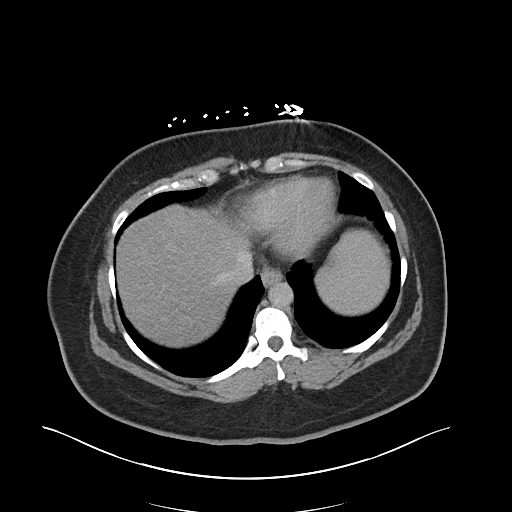

[Series 6: abdomen 3.0 mpr cor · coronal · 0.80mm/px · 3 of 119 slices shown]
[im 40/119  soft-tissue]
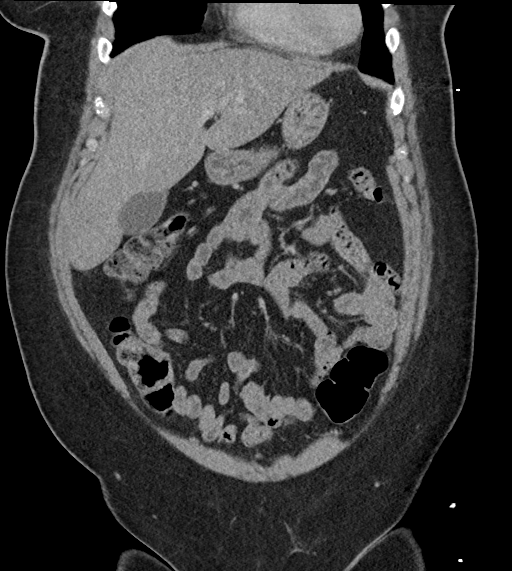
[im 53/119  soft-tissue]
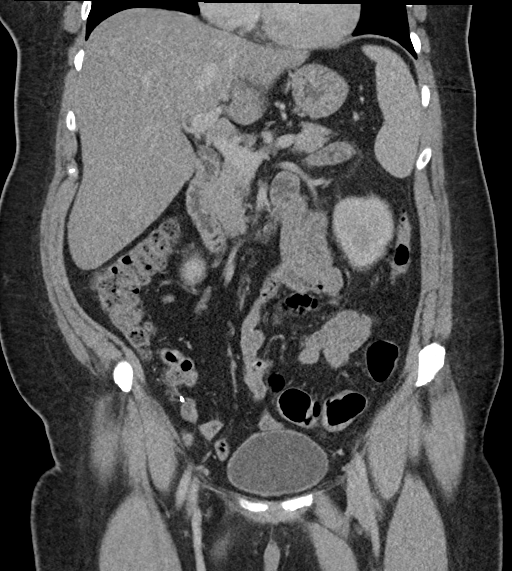
[im 66/119  soft-tissue]
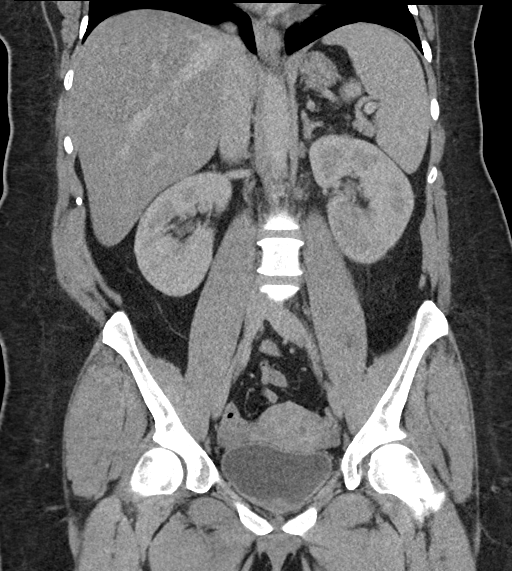

[16 of 46 positions shown; findings below may reference images not displayed]

FINDINGS: Lower chest: Lung bases are clear.

Hepatobiliary: Liver is within normal limits.

Gallbladder is unremarkable. No intrahepatic or extrahepatic ductal
dilatation.

Pancreas: Within normal limits.

Spleen: Within normal limits.

Adrenals/Urinary Tract: Adrenal glands are within normal limits.

On delayed imaging, there is mildly heterogeneous enhancement of the
posterior left lower kidney (series 8/image 24), nonspecific but
suggesting pyelonephritis. Kidneys are otherwise unremarkable. No
hydronephrosis.

Bladder is within normal limits.

Stomach/Bowel: Stomach is within normal limits.

No evidence of bowel obstruction.

Prior appendectomy.

No colonic wall thickening or inflammatory changes.

Vascular/Lymphatic: No evidence of abdominal aortic aneurysm.

Mild atherosclerotic calcifications of the infrarenal abdominal
aorta.

No suspicious abdominopelvic lymphadenopathy.

Reproductive: Uterus is within normal limits.

Bilateral ovaries are within normal limits.

Other: No abdominopelvic ascites.

Musculoskeletal: Visualized osseous structures are within normal
limits.
IMPRESSION: Heterogeneous enhancement of the left lower kidney, nonspecific but
suggesting pyelonephritis.

Otherwise negative CT abdomen/pelvis.

## 2022-11-06 ENCOUNTER — Other Ambulatory Visit: Payer: Self-pay

## 2022-11-06 ENCOUNTER — Emergency Department (HOSPITAL_COMMUNITY): Payer: Medicaid Other

## 2022-11-06 ENCOUNTER — Observation Stay (HOSPITAL_COMMUNITY)
Admission: EM | Admit: 2022-11-06 | Discharge: 2022-11-07 | Disposition: A | Discharge status: Home / Self Care | Payer: Medicaid Other | Attending: Family Medicine | Admitting: Family Medicine

## 2022-11-06 ENCOUNTER — Encounter (HOSPITAL_COMMUNITY): Payer: Self-pay

## 2022-11-06 DIAGNOSIS — R079 Chest pain, unspecified: Secondary | ICD-10-CM | POA: Diagnosis not present

## 2022-11-06 DIAGNOSIS — T63441A Toxic effect of venom of bees, accidental (unintentional), initial encounter: Secondary | ICD-10-CM | POA: Diagnosis not present

## 2022-11-06 DIAGNOSIS — T7840XA Allergy, unspecified, initial encounter: Secondary | ICD-10-CM | POA: Diagnosis present

## 2022-11-06 DIAGNOSIS — R7989 Other specified abnormal findings of blood chemistry: Secondary | ICD-10-CM | POA: Insufficient documentation

## 2022-11-06 DIAGNOSIS — Z79899 Other long term (current) drug therapy: Secondary | ICD-10-CM | POA: Insufficient documentation

## 2022-11-06 DIAGNOSIS — E876 Hypokalemia: Secondary | ICD-10-CM | POA: Diagnosis not present

## 2022-11-06 DIAGNOSIS — R739 Hyperglycemia, unspecified: Secondary | ICD-10-CM | POA: Insufficient documentation

## 2022-11-06 DIAGNOSIS — F17200 Nicotine dependence, unspecified, uncomplicated: Secondary | ICD-10-CM | POA: Diagnosis not present

## 2022-11-06 LAB — URINALYSIS, ROUTINE W REFLEX MICROSCOPIC
Bacteria, UA: NONE SEEN
Bilirubin Urine: NEGATIVE
Glucose, UA: >=500 mg/dL — AB
Ketones, ur: 5 mg/dL — AB
Leukocytes,Ua: NEGATIVE
Nitrite: NEGATIVE
Protein, ur: NEGATIVE mg/dL
Specific Gravity, Urine: 1.012 (ref 1.005–1.030)
pH: 6 (ref 5.0–8.0)

## 2022-11-06 LAB — CBC WITH DIFFERENTIAL/PLATELET
Abs Immature Granulocytes: 0.06 10*3/uL (ref 0.00–0.07)
Basophils Absolute: 0 10*3/uL (ref 0.0–0.1)
Basophils Relative: 0 %
Eosinophils Absolute: 0.1 10*3/uL (ref 0.0–0.5)
Eosinophils Relative: 1 %
HCT: 44.4 % (ref 36.0–46.0)
Hemoglobin: 13.8 g/dL (ref 12.0–15.0)
Immature Granulocytes: 1 %
Lymphocytes Relative: 43 %
Lymphs Abs: 3.9 10*3/uL (ref 0.7–4.0)
MCH: 26.4 pg (ref 26.0–34.0)
MCHC: 31.1 g/dL (ref 30.0–36.0)
MCV: 85.1 fL (ref 80.0–100.0)
Monocytes Absolute: 0.5 10*3/uL (ref 0.1–1.0)
Monocytes Relative: 5 %
Neutro Abs: 4.5 10*3/uL (ref 1.7–7.7)
Neutrophils Relative %: 50 %
Platelets: 397 10*3/uL (ref 150–400)
RBC: 5.22 MIL/uL — ABNORMAL HIGH (ref 3.87–5.11)
RDW: 15.9 % — ABNORMAL HIGH (ref 11.5–15.5)
WBC: 9 10*3/uL (ref 4.0–10.5)
nRBC: 0 % (ref 0.0–0.2)

## 2022-11-06 LAB — COMPREHENSIVE METABOLIC PANEL
ALT: 29 U/L (ref 0–44)
AST: 27 U/L (ref 15–41)
Albumin: 4 g/dL (ref 3.5–5.0)
Alkaline Phosphatase: 108 U/L (ref 38–126)
Anion gap: 12 (ref 5–15)
BUN: 12 mg/dL (ref 6–20)
CO2: 22 mmol/L (ref 22–32)
Calcium: 9.1 mg/dL (ref 8.9–10.3)
Chloride: 100 mmol/L (ref 98–111)
Creatinine, Ser: 0.89 mg/dL (ref 0.44–1.00)
GFR, Estimated: >60 mL/min (ref >60)
Glucose, Bld: 166 mg/dL — ABNORMAL HIGH (ref 70–99)
Potassium: 3.3 mmol/L — ABNORMAL LOW (ref 3.5–5.1)
Sodium: 134 mmol/L — ABNORMAL LOW (ref 135–145)
Total Bilirubin: 0.5 mg/dL (ref 0.3–1.2)
Total Protein: 7.8 g/dL (ref 6.5–8.1)

## 2022-11-06 LAB — CBG MONITORING, ED: Glucose-Capillary: 247 mg/dL — ABNORMAL HIGH (ref 70–99)

## 2022-11-06 LAB — TROPONIN I (HIGH SENSITIVITY)
Troponin I (High Sensitivity): 5 ng/L (ref <18)
Troponin I (High Sensitivity): 39 ng/L — ABNORMAL HIGH (ref <18)
Troponin I (High Sensitivity): 35 ng/L — ABNORMAL HIGH (ref <18)

## 2022-11-06 MED ORDER — FAMOTIDINE IN NACL 20-0.9 MG/50ML-% IV SOLN
20.0000 mg | Freq: Once | INTRAVENOUS | Status: AC
  Administered 2022-11-06: 20 mg via INTRAVENOUS
  Filled 2022-11-06: qty 50

## 2022-11-06 MED ORDER — HEPARIN SODIUM (PORCINE) 5000 UNIT/ML IJ SOLN
5000.0000 [IU] | Freq: Three times a day (TID) | INTRAMUSCULAR | Status: DC
  Administered 2022-11-06 – 2022-11-07 (×2): 5000 [IU] via SUBCUTANEOUS
  Filled 2022-11-06 (×2): qty 1

## 2022-11-06 MED ORDER — FAMOTIDINE IN NACL 20-0.9 MG/50ML-% IV SOLN
20.0000 mg | Freq: Two times a day (BID) | INTRAVENOUS | Status: DC
  Administered 2022-11-06: 20 mg via INTRAVENOUS
  Filled 2022-11-06 (×2): qty 50

## 2022-11-06 MED ORDER — OXYCODONE HCL 5 MG PO TABS
5.0000 mg | ORAL_TABLET | ORAL | Status: DC | PRN
  Administered 2022-11-06 – 2022-11-07 (×2): 5 mg via ORAL
  Filled 2022-11-06 (×2): qty 1

## 2022-11-06 MED ORDER — ACETAMINOPHEN 325 MG PO TABS
650.0000 mg | ORAL_TABLET | Freq: Once | ORAL | Status: AC
  Administered 2022-11-06: 650 mg via ORAL
  Filled 2022-11-06: qty 2

## 2022-11-06 MED ORDER — INSULIN ASPART 100 UNIT/ML IJ SOLN
0.0000 [IU] | Freq: Every day | INTRAMUSCULAR | Status: DC
  Administered 2022-11-06: 2 [IU] via SUBCUTANEOUS
  Filled 2022-11-06: qty 1

## 2022-11-06 MED ORDER — EPINEPHRINE 0.3 MG/0.3ML IJ SOAJ
0.3000 mg | Freq: Once | INTRAMUSCULAR | Status: AC
  Administered 2022-11-06: 0.3 mg via INTRAMUSCULAR

## 2022-11-06 MED ORDER — ACETAMINOPHEN 650 MG RE SUPP: 650.0000 mg | Freq: Four times a day (QID) | RECTAL | Status: DC | PRN

## 2022-11-06 MED ORDER — METHYLPREDNISOLONE SODIUM SUCC 125 MG IJ SOLR
125.0000 mg | Freq: Once | INTRAMUSCULAR | Status: AC
  Administered 2022-11-06: 125 mg via INTRAVENOUS
  Filled 2022-11-06: qty 2

## 2022-11-06 MED ORDER — ONDANSETRON HCL 4 MG/2ML IJ SOLN: 4.0000 mg | Freq: Four times a day (QID) | INTRAMUSCULAR | Status: DC | PRN

## 2022-11-06 MED ORDER — MORPHINE SULFATE (PF) 2 MG/ML IV SOLN
2.0000 mg | INTRAVENOUS | Status: DC | PRN
  Administered 2022-11-07 (×2): 2 mg via INTRAVENOUS
  Filled 2022-11-06 (×2): qty 1

## 2022-11-06 MED ORDER — POTASSIUM CHLORIDE 10 MEQ/100ML IV SOLN
10.0000 meq | INTRAVENOUS | Status: AC
  Administered 2022-11-06 – 2022-11-07 (×4): 10 meq via INTRAVENOUS
  Filled 2022-11-06 (×4): qty 100

## 2022-11-06 MED ORDER — INSULIN ASPART 100 UNIT/ML IJ SOLN
0.0000 [IU] | Freq: Three times a day (TID) | INTRAMUSCULAR | Status: DC
  Administered 2022-11-07: 5 [IU] via SUBCUTANEOUS

## 2022-11-06 MED ORDER — SODIUM CHLORIDE 0.9 % IV BOLUS
2000.0000 mL | Freq: Once | INTRAVENOUS | Status: AC
  Administered 2022-11-06: 2000 mL via INTRAVENOUS

## 2022-11-06 MED ORDER — ONDANSETRON HCL 4 MG PO TABS: 4.0000 mg | ORAL_TABLET | Freq: Four times a day (QID) | ORAL | Status: DC | PRN

## 2022-11-06 MED ORDER — METHYLPREDNISOLONE SODIUM SUCC 125 MG IJ SOLR
125.0000 mg | Freq: Every day | INTRAMUSCULAR | Status: DC
  Administered 2022-11-07: 125 mg via INTRAVENOUS
  Filled 2022-11-06 (×2): qty 2

## 2022-11-06 MED ORDER — DIPHENHYDRAMINE HCL 50 MG/ML IJ SOLN
50.0000 mg | Freq: Once | INTRAMUSCULAR | Status: AC
  Administered 2022-11-06: 50 mg via INTRAVENOUS
  Filled 2022-11-06: qty 1

## 2022-11-06 MED ORDER — DIPHENHYDRAMINE HCL 50 MG/ML IJ SOLN
25.0000 mg | Freq: Four times a day (QID) | INTRAMUSCULAR | Status: DC
  Administered 2022-11-06 – 2022-11-07 (×3): 25 mg via INTRAVENOUS
  Filled 2022-11-06 (×3): qty 1

## 2022-11-06 MED ORDER — ACETAMINOPHEN 325 MG PO TABS
650.0000 mg | ORAL_TABLET | Freq: Four times a day (QID) | ORAL | Status: DC | PRN
  Administered 2022-11-07 (×2): 650 mg via ORAL
  Filled 2022-11-06 (×2): qty 2

## 2022-11-06 NOTE — ED Provider Notes (Signed)
[place] EMERGENCY DEPARTMENT AT [facility] Provider Note   CSN: [id number] Arrival date & time: 11/06/22  1545     History  Chief Complaint  Patient presents with   Allergic Reaction    JONATHAN DANGEL is a 47 y.o. female.  Patient states she was stung on the foot and then started to have shortness of breath and chest pain.  Patient has no past medical history but does not really see a doctor.  When she arrived to the emergency department she was perspiring profusely and was having chest pain and was hypotensive.  She was given epinephrine initially and then Benadryl Solu-Medrol and Pepcid.  The history is provided by the patient and medical records. No language interpreter was used.  Allergic Reaction Presenting symptoms: difficulty breathing   Presenting symptoms: no rash   Severity:  Moderate Prior allergic episodes:  No prior episodes Context comment:  Insect bite Relieved by:  Nothing Worsened by:  Nothing Ineffective treatments:  None tried      Home Medications Prior to Admission medications   Medication Sig Start Date End Date Taking? Authorizing Provider  ibuprofen (ADVIL,MOTRIN) 200 MG tablet Take 400 mg by mouth every 6 (six) hours as needed for mild pain.   Yes [provider]  Multiple Vitamin (MULTIVITAMIN) tablet Take 1 tablet by mouth daily.   Yes [provider]      Allergies    Bee venom, Lidocaine, Sulfa antibiotics, and Iohexol    Review of Systems   Review of Systems  Constitutional:  Negative for appetite change and fatigue.  HENT:  Negative for congestion, ear discharge and sinus pressure.   Eyes:  Negative for discharge.  Respiratory:  Negative for cough.   Cardiovascular:  Positive for chest pain.  Gastrointestinal:  Negative for abdominal pain and diarrhea.  Genitourinary:  Negative for frequency and hematuria.  Musculoskeletal:  Negative for back pain.  Skin:  Negative for rash.  Neurological:  Negative  for seizures and headaches.  Psychiatric/Behavioral:  Negative for hallucinations.     Physical Exam Updated Vital Signs BP (!) 158/98 (BP Location: Left Arm)   Pulse 100   Temp 98.4 F (36.9 C) (Oral)   Resp 20   Ht 5\' 9"  (1.753 m)   Wt 95.3 kg   SpO2 94%   BMI 31.01 kg/m  Physical Exam Vitals and nursing note reviewed.  Constitutional:      Appearance: She is well-developed. She is ill-appearing.     Comments: Rash to face and neck  HENT:     Head: Normocephalic.     Nose: Nose normal.  Eyes:     General: No scleral icterus.    Conjunctiva/sclera: Conjunctivae normal.  Neck:     Thyroid: No thyromegaly.  Cardiovascular:     Rate and Rhythm: Regular rhythm. Tachycardia present.     Heart sounds: No murmur heard.    No friction rub. No gallop.  Pulmonary:     Breath sounds: No stridor. Wheezing present. No rales.  Chest:     Chest wall: No tenderness.  Abdominal:     General: There is no distension.     Tenderness: There is no abdominal tenderness. There is no rebound.  Musculoskeletal:        General: Normal range of motion.     Cervical back: Neck supple.  Lymphadenopathy:     Cervical: No cervical adenopathy.  Skin:    Findings: Erythema and rash present.  Neurological:  Mental Status: She is alert and oriented to person, place, and time.     Motor: No abnormal muscle tone.     Coordination: Coordination normal.  Psychiatric:        Behavior: Behavior normal.     ED Results / Procedures / Treatments   Labs (all labs ordered are listed, but only abnormal results are displayed) Labs Reviewed  CBC WITH DIFFERENTIAL/PLATELET - Abnormal; Notable for the following components:      Result Value   RBC 5.22 (*)    RDW 15.9 (*)    All other components within normal limits  COMPREHENSIVE METABOLIC PANEL - Abnormal; Notable for the following components:   Sodium 134 (*)    Potassium 3.3 (*)    Glucose, Bld 166 (*)    All other components within normal  limits  TROPONIN I (HIGH SENSITIVITY) - Abnormal; Notable for the following components:   Troponin I (High Sensitivity) 39 (*)    All other components within normal limits  TROPONIN I (HIGH SENSITIVITY)    EKG EKG Interpretation Date/Time:  Thursday November 06 2022 16:00:26 EDT Ventricular Rate:  118 PR Interval:  129 QRS Duration:  101 QT Interval:  329 QTC Calculation: 461 R Axis:   73  Text Interpretation: Sinus tachycardia Borderline repolarization abnormality Confirmed by Bethann Berkshire [phone]) on 11/06/2022 7:33:09 PM  Radiology DG Chest Port 1 View  Result Date: 11/06/2022 CLINICAL DATA:  Shortness of breath and diaphoresis, the patient was stung by a bee and states she has an allergy. EXAM: PORTABLE CHEST 1 VIEW COMPARISON:  11/21/2020 FINDINGS: The patient is rotated to the right on today's radiograph, reducing diagnostic sensitivity and specificity. The lungs appear clear. Cardiac and mediastinal contours normal. No blunting of the costophrenic angles. No significant osseous abnormality. IMPRESSION: 1. No acute findings. Electronically Signed   By: Gaylyn Rong M.D.   On: 11/06/2022 18:31    Procedures Procedures    Medications Ordered in ED Medications  EPINEPHrine (EPI-PEN) injection 0.3 mg (0.3 mg Intramuscular Given 11/06/22 1549)  sodium chloride 0.9 % bolus 2,000 mL (0 mLs Intravenous Stopped 11/06/22 1855)  diphenhydrAMINE (BENADRYL) injection 50 mg (50 mg Intravenous Given 11/06/22 1604)  famotidine (PEPCID) IVPB 20 mg premix (0 mg Intravenous Stopped 11/06/22 1726)  methylPREDNISolone sodium succinate (SOLU-MEDROL) 125 mg/2 mL injection 125 mg (125 mg Intravenous Given 11/06/22 1604)  acetaminophen (TYLENOL) tablet 650 mg (650 mg Oral Given 11/06/22 1725)    ED Course/ Medical Decision Making/ A&P  CRITICAL CARE Performed by: Bethann Berkshire Total critical care time: 40 minutes Critical care time was exclusive of separately billable procedures and treating  other patients. Critical care was necessary to treat or prevent imminent or life-threatening deterioration. Critical care was time spent personally by me on the following activities: development of treatment plan with patient and/or surrogate as well as nursing, discussions with consultants, evaluation of patient's response to treatment, examination of patient, obtaining history from patient or surrogate, ordering and performing treatments and interventions, ordering and review of laboratory studies, ordering and review of radiographic studies, pulse oximetry and re-evaluation of patient's condition.                                Medical Decision Making Amount and/or Complexity of Data Reviewed Labs: ordered. Radiology: ordered. ECG/medicine tests: ordered.  Risk OTC drugs. Prescription drug management. Decision regarding hospitalization.  This patient presents to the ED for concern  of allergic reaction, and chest pain this involves an extensive number of treatment options, and is a complaint that carries with it a high risk of complications and morbidity.  The differential diagnosis includes allergic reaction and coronary artery disease hypertension   Co morbidities that complicate the patient evaluation  Hypertension   Additional history obtained:  Additional history obtained from patient External records from outside source obtained and reviewed including hospital records   Lab Tests:  I Ordered, and personally interpreted labs.  The pertinent results include: Troponin 35   Imaging Studies ordered:  I ordered imaging studies including chest x-ray I independently visualized and interpreted imaging which showed negative I agree with the radiologist interpretation   Cardiac Monitoring: / EKG:  The patient was maintained on a cardiac monitor.  I personally viewed and interpreted the cardiac monitored which showed an underlying rhythm of: Normal sinus  rhythm   Consultations Obtained:  I requested consultation with the hospitalist,  and discussed lab and imaging findings as well as pertinent plan - they recommend: Admit   Problem List / ED Course / Critical interventions / Medication management  Allergic reaction and chest pain I ordered medication including epinephrine Benadryl Pepcid saline Reevaluation of the patient after these medicines showed that the patient improved I have reviewed the patients home medicines and have made adjustments as needed   Social Determinants of Health:  None   Test / Admission - Considered:  None  Allergic reaction with chest pain and elevated troponin.  Patient will be admitted to medicine        Final Clinical Impression(s) / ED Diagnoses Final diagnoses:  Allergic reaction, initial encounter  Chest pain syndrome    Rx / DC Orders ED Discharge Orders     None         Bethann Berkshire, MD 11/07/22 1137

## 2022-11-06 NOTE — ED Triage Notes (Signed)
Bee sting stung 15 mins PTA Pt yelling and talking in full sentences in triage Pt very diaphoretic.   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 Last time needed epi pen was when she was a child  Epi pen given in triage Called PA for exam.  Lung sounds clear PA in triage

## 2022-11-06 NOTE — ED Notes (Signed)
ED TO INPATIENT HANDOFF REPORT  ED Nurse Name and Phone #: Ladarryl Wrage [phone]  S Name/Age/Gender Candace Cruise 47 y.o. female Room/Bed: APA07/APA07  Code Status   Code Status: Full Code  Home/SNF/Other Home Patient oriented to: self, place, time, and situation Is this baseline? Yes   Triage Complete: Triage complete  Chief Complaint Chest pain [R07.9]  Triage Note Bee sting stung 15 mins PTA Pt yelling and talking in full sentences in triage Pt very diaphoretic.   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 Last time needed epi pen was when she was a child  Epi pen given in triage Called PA for exam.  Lung sounds clear PA in triage    Allergies Allergies  Allergen Reactions   Bee Venom Anaphylaxis   Lidocaine Hives   Sulfa Antibiotics Hives   Iohexol Hives    Pt. Had CT scan at [facility] and had hives after scan.       Level of Care/Admitting Diagnosis ED Disposition     ED Disposition  Admit   Condition  --   Comment  Hospital Area: [facility] [[id number]]  Level of Care: Telemetry [5]  Covid Evaluation: Asymptomatic - no recent exposure (last 10 days) testing not required  Diagnosis: Chest pain [564332]  Admitting Physician: Lilyan Gilford [[id number]]  Attending Physician: Lilyan Gilford [[id number]]          B Medical/Surgery History History reviewed. No pertinent past medical history. Past Surgical History:  Procedure Laterality Date   APPENDECTOMY       A IV Location/Drains/Wounds Patient Lines/Drains/Airways Status     Active Line/Drains/Airways     Name Placement date Placement time Site Days   Peripheral IV 11/06/22 20 G Left;Posterior Hand 11/06/22  2152  Hand  less than 1            Intake/Output Last 24 hours  Intake/Output Summary (Last 24 hours) at 11/06/2022 2223 Last data filed at 11/06/2022 1855 Gross per 24 hour  Intake 2000 ml  Output --  Net 2000 ml    Labs/Imaging Results for orders  placed or performed during the hospital encounter of 11/06/22 (from the past 48 hour(s))  CBC with Differential     Status: Abnormal   Collection Time: 11/06/22  4:06 PM  Result Value Ref Range   WBC 9.0 4.0 - 10.5 K/uL   RBC 5.22 (H) 3.87 - 5.11 MIL/uL   Hemoglobin 13.8 12.0 - 15.0 g/dL   HCT 60.1 09.3 - 23.5 %   MCV 85.1 80.0 - 100.0 fL   MCH 26.4 26.0 - 34.0 pg   MCHC 31.1 30.0 - 36.0 g/dL   RDW 57.3 (H) 22.0 - 25.4 %   Platelets 397 150 - 400 K/uL   nRBC 0.0 0.0 - 0.2 %   Neutrophils Relative % 50 %   Neutro Abs 4.5 1.7 - 7.7 K/uL   Lymphocytes Relative 43 %   Lymphs Abs 3.9 0.7 - 4.0 K/uL   Monocytes Relative 5 %   Monocytes Absolute 0.5 0.1 - 1.0 K/uL   Eosinophils Relative 1 %   Eosinophils Absolute 0.1 0.0 - 0.5 K/uL   Basophils Relative 0 %   Basophils Absolute 0.0 0.0 - 0.1 K/uL   Immature Granulocytes 1 %   Abs Immature Granulocytes 0.06 0.00 - 0.07 K/uL    Comment: Performed at [facility], [address], [place], [place] [address]  Comprehensive metabolic panel  Status: Abnormal   Collection Time: 11/06/22  4:06 PM  Result Value Ref Range   Sodium 134 (L) 135 - 145 mmol/L   Potassium 3.3 (L) 3.5 - 5.1 mmol/L   Chloride 100 98 - 111 mmol/L   CO2 22 22 - 32 mmol/L   Glucose, Bld 166 (H) 70 - 99 mg/dL    Comment: Glucose reference range applies only to samples taken after fasting for at least 8 hours.   BUN 12 6 - 20 mg/dL   Creatinine, Ser 1.61 0.44 - 1.00 mg/dL   Calcium 9.1 8.9 - 09.6 mg/dL   Total Protein 7.8 6.5 - 8.1 g/dL   Albumin 4.0 3.5 - 5.0 g/dL   AST 27 15 - 41 U/L   ALT 29 0 - 44 U/L   Alkaline Phosphatase 108 38 - 126 U/L   Total Bilirubin 0.5 0.3 - 1.2 mg/dL   GFR, Estimated >04 >54 mL/min    Comment: (NOTE) Calculated using the CKD-EPI Creatinine Equation (2021)    Anion gap 12 5 - 15    Comment: Performed at [facility], [address], [place], [place] [address]  Troponin I (High Sensitivity)     Status: None   Collection Time:  11/06/22  4:06 PM  Result Value Ref Range   Troponin I (High Sensitivity) 5 <18 ng/L    Comment: (NOTE) Elevated high sensitivity troponin I (hsTnI) values and significant  changes across serial measurements may suggest ACS but many other  chronic and acute conditions are known to elevate hsTnI results.  Refer to the "Links" section for chest pain algorithms and additional  guidance. Performed at [facility], [address], [place], [place] [address]   Troponin I (High Sensitivity)     Status: Abnormal   Collection Time: 11/06/22  6:08 PM  Result Value Ref Range   Troponin I (High Sensitivity) 39 (H) <18 ng/L    Comment: DELTA CHECK NOTED RESULT CALLED TO, READ BACK BY AND VERIFIED WITH Katiria Calame,K ON 11/06/22 AT 1900 BY LOY,C (NOTE) Elevated high sensitivity troponin I (hsTnI) values and significant  changes across serial measurements may suggest ACS but many other  chronic and acute conditions are known to elevate hsTnI results.  Refer to the "Links" section for chest pain algorithms and additional  guidance. Performed at [facility], [address], [place], [place] [address]   CBG monitoring, ED     Status: Abnormal   Collection Time: 11/06/22  9:07 PM  Result Value Ref Range   Glucose-Capillary 247 (H) 70 - 99 mg/dL    Comment: Glucose reference range applies only to samples taken after fasting for at least 8 hours.   DG Chest Port 1 View  Result Date: 11/06/2022 CLINICAL DATA:  Shortness of breath and diaphoresis, the patient was stung by a bee and states she has an allergy. EXAM: PORTABLE CHEST 1 VIEW COMPARISON:  11/21/2020 FINDINGS: The patient is rotated to the right on today's radiograph, reducing diagnostic sensitivity and specificity. The lungs appear clear. Cardiac and mediastinal contours normal. No blunting of the costophrenic angles. No significant osseous abnormality. IMPRESSION: 1. No acute findings. Electronically Signed   By: Gaylyn Rong M.D.   On: 11/06/2022  18:31    Pending Labs Unresulted Labs (From admission, onward)     Start     Ordered   11/07/22 0500  Hemoglobin A1c  Tomorrow morning,   R       Comments: To assess prior glycemic control  11/06/22 2018   11/07/22 0500  Comprehensive metabolic panel  Tomorrow morning,   R        11/06/22 2018   11/07/22 0500  Magnesium  Tomorrow morning,   R        11/06/22 2018   11/07/22 0500  CBC with Differential/Platelet  Tomorrow morning,   R        11/06/22 2018   11/06/22 2019  HIV Antibody (routine testing w rflx)  (HIV Antibody (Routine testing w reflex) panel)  Once,   R        11/06/22 2018   11/06/22 2019  Urinalysis, Routine w reflex microscopic -Urine, Clean Catch  Once,   R       Question:  Specimen Source  Answer:  Urine, Clean Catch   11/06/22 2018            Vitals/Pain Today's Vitals   11/06/22 1920 11/06/22 1930 11/06/22 2120 11/06/22 2130  BP: (!) 158/98 (!) 176/91  (!) 171/118  Pulse: 100 (!) 102  (!) 112  Resp: 20   (!) 23  Temp: 98.4 F (36.9 C)     TempSrc: Oral     SpO2: 94% 93%  96%  Weight:      Height:      PainSc: 8   8      Isolation Precautions No active isolations  Medications Medications  insulin aspart (novoLOG) injection 0-15 Units (has no administration in time range)  insulin aspart (novoLOG) injection 0-5 Units (2 Units Subcutaneous Given 11/06/22 2124)  heparin injection 5,000 Units (5,000 Units Subcutaneous Given 11/06/22 2124)  acetaminophen (TYLENOL) tablet 650 mg (has no administration in time range)    Or  acetaminophen (TYLENOL) suppository 650 mg (has no administration in time range)  oxyCODONE (Oxy IR/ROXICODONE) immediate release tablet 5 mg (5 mg Oral Given 11/06/22 2119)  ondansetron (ZOFRAN) tablet 4 mg (has no administration in time range)    Or  ondansetron (ZOFRAN) injection 4 mg (has no administration in time range)  morphine (PF) 2 MG/ML injection 2 mg (has no administration in time range)  potassium chloride 10 mEq  in 100 mL IVPB (10 mEq Intravenous New Bag/Given 11/06/22 2215)  diphenhydrAMINE (BENADRYL) injection 25 mg (25 mg Intravenous Given 11/06/22 2119)  methylPREDNISolone sodium succinate (SOLU-MEDROL) 125 mg/2 mL injection 125 mg (125 mg Intravenous Not Given 11/06/22 2045)  famotidine (PEPCID) IVPB 20 mg premix (0 mg Intravenous Stopped 11/06/22 2146)  EPINEPHrine (EPI-PEN) injection 0.3 mg (0.3 mg Intramuscular Given 11/06/22 1549)  sodium chloride 0.9 % bolus 2,000 mL (0 mLs Intravenous Stopped 11/06/22 1855)  diphenhydrAMINE (BENADRYL) injection 50 mg (50 mg Intravenous Given 11/06/22 1604)  famotidine (PEPCID) IVPB 20 mg premix (0 mg Intravenous Stopped 11/06/22 1726)  methylPREDNISolone sodium succinate (SOLU-MEDROL) 125 mg/2 mL injection 125 mg (125 mg Intravenous Given 11/06/22 1604)  acetaminophen (TYLENOL) tablet 650 mg (650 mg Oral Given 11/06/22 1725)    Mobility walks     Focused Assessments Cardiac Assessment Handoff:  Cardiac Rhythm: Sinus tachycardia No results found for: "CKTOTAL", "CKMB", "CKMBINDEX", "TROPONINI" Lab Results  Component Value Date   DDIMER 0.95 (H) 11/23/2020   Does the Patient currently have chest pain? No    R Recommendations: See Admitting Provider Note  Report given to:   Additional Notes: swelling of right foot from sting

## 2022-11-07 DIAGNOSIS — R079 Chest pain, unspecified: Secondary | ICD-10-CM | POA: Diagnosis not present

## 2022-11-07 DIAGNOSIS — T7840XA Allergy, unspecified, initial encounter: Secondary | ICD-10-CM

## 2022-11-07 DIAGNOSIS — I10 Essential (primary) hypertension: Secondary | ICD-10-CM

## 2022-11-07 DIAGNOSIS — E876 Hypokalemia: Secondary | ICD-10-CM

## 2022-11-07 DIAGNOSIS — F17200 Nicotine dependence, unspecified, uncomplicated: Secondary | ICD-10-CM

## 2022-11-07 DIAGNOSIS — R7989 Other specified abnormal findings of blood chemistry: Secondary | ICD-10-CM | POA: Diagnosis not present

## 2022-11-07 DIAGNOSIS — R739 Hyperglycemia, unspecified: Secondary | ICD-10-CM | POA: Diagnosis not present

## 2022-11-07 DIAGNOSIS — T7840XD Allergy, unspecified, subsequent encounter: Secondary | ICD-10-CM

## 2022-11-07 LAB — COMPREHENSIVE METABOLIC PANEL
ALT: 30 U/L (ref 0–44)
AST: 21 U/L (ref 15–41)
Albumin: 3.6 g/dL (ref 3.5–5.0)
Alkaline Phosphatase: 96 U/L (ref 38–126)
Anion gap: 10 (ref 5–15)
BUN: 11 mg/dL (ref 6–20)
CO2: 23 mmol/L (ref 22–32)
Calcium: 9.1 mg/dL (ref 8.9–10.3)
Chloride: 101 mmol/L (ref 98–111)
Creatinine, Ser: 0.62 mg/dL (ref 0.44–1.00)
GFR, Estimated: >60 mL/min (ref >60)
Glucose, Bld: 248 mg/dL — ABNORMAL HIGH (ref 70–99)
Potassium: 4.1 mmol/L (ref 3.5–5.1)
Sodium: 134 mmol/L — ABNORMAL LOW (ref 135–145)
Total Bilirubin: 0.2 mg/dL — ABNORMAL LOW (ref 0.3–1.2)
Total Protein: 7.1 g/dL (ref 6.5–8.1)

## 2022-11-07 LAB — CBC WITH DIFFERENTIAL/PLATELET
Abs Immature Granulocytes: 0.06 10*3/uL (ref 0.00–0.07)
Basophils Absolute: 0 10*3/uL (ref 0.0–0.1)
Basophils Relative: 0 %
Eosinophils Absolute: 0 10*3/uL (ref 0.0–0.5)
Eosinophils Relative: 0 %
HCT: 35.8 % — ABNORMAL LOW (ref 36.0–46.0)
Hemoglobin: 11.5 g/dL — ABNORMAL LOW (ref 12.0–15.0)
Immature Granulocytes: 1 %
Lymphocytes Relative: 8 %
Lymphs Abs: 0.7 10*3/uL (ref 0.7–4.0)
MCH: 27.1 pg (ref 26.0–34.0)
MCHC: 32.1 g/dL (ref 30.0–36.0)
MCV: 84.2 fL (ref 80.0–100.0)
Monocytes Absolute: 0.2 10*3/uL (ref 0.1–1.0)
Monocytes Relative: 2 %
Neutro Abs: 8.4 10*3/uL — ABNORMAL HIGH (ref 1.7–7.7)
Neutrophils Relative %: 89 %
Platelets: 249 10*3/uL (ref 150–400)
RBC: 4.25 MIL/uL (ref 3.87–5.11)
RDW: 15.9 % — ABNORMAL HIGH (ref 11.5–15.5)
WBC: 9.4 10*3/uL (ref 4.0–10.5)
nRBC: 0 % (ref 0.0–0.2)

## 2022-11-07 LAB — GLUCOSE, CAPILLARY: Glucose-Capillary: 217 mg/dL — ABNORMAL HIGH (ref 70–99)

## 2022-11-07 LAB — TROPONIN I (HIGH SENSITIVITY): Troponin I (High Sensitivity): 21 ng/L — ABNORMAL HIGH (ref <18)

## 2022-11-07 LAB — HIV ANTIBODY (ROUTINE TESTING W REFLEX): HIV Screen 4th Generation wRfx: NONREACTIVE

## 2022-11-07 LAB — MAGNESIUM: Magnesium: 1.7 mg/dL (ref 1.7–2.4)

## 2022-11-07 MED ORDER — ATENOLOL 25 MG PO TABS
25.0000 mg | ORAL_TABLET | Freq: Every day | ORAL | 1 refills | Status: AC
Start: 2022-11-07 — End: 2023-11-07

## 2022-11-07 MED ORDER — EPINEPHRINE 0.3 MG/0.3ML IJ SOAJ: 0.3000 mg | INTRAMUSCULAR | 2 refills | Status: AC | PRN

## 2022-11-07 MED ORDER — LORATADINE 10 MG PO TABS: 10.0000 mg | ORAL_TABLET | Freq: Every day | ORAL | 0 refills | Status: AC

## 2022-11-07 MED ORDER — PREDNISONE 20 MG PO TABS: 20.0000 mg | ORAL_TABLET | Freq: Every day | ORAL | 0 refills | Status: DC

## 2022-11-07 MED ORDER — FAMOTIDINE 20 MG PO TABS: 20.0000 mg | ORAL_TABLET | Freq: Every day | ORAL | 0 refills | Status: AC

## 2022-11-07 MED ORDER — ORAL CARE MOUTH RINSE: 15.0000 mL | OROMUCOSAL | Status: DC | PRN

## 2022-11-07 MED ORDER — FAMOTIDINE 20 MG PO TABS
20.0000 mg | ORAL_TABLET | Freq: Every day | ORAL | Status: DC
  Administered 2022-11-07: 20 mg via ORAL
  Filled 2022-11-07: qty 1

## 2022-11-07 MED ORDER — MORPHINE SULFATE (PF) 2 MG/ML IV SOLN: 1.0000 mg | INTRAVENOUS | Status: DC | PRN

## 2022-11-07 NOTE — Assessment & Plan Note (Signed)
-   Troponin 5 and then 39 - Trending back down - Likely demand ischemia in the setting of hypotension after allergic reaction borderline anaphylactic shock - Blood pressures improved after 2 L of fluid - Patient did have chest tightness during her allergic reaction, but it has been improved ever since she was treated in the ED without recurrence - Monitoring on telemetry

## 2022-11-07 NOTE — Discharge Instructions (Signed)
IMPORTANT INFORMATION: PAY CLOSE ATTENTION   PHYSICIAN DISCHARGE INSTRUCTIONS  Follow with Primary care provider  Pcp, No  and other consultants as instructed by your Hospitalist Physician  SEEK MEDICAL CARE OR RETURN TO EMERGENCY ROOM IF SYMPTOMS COME BACK, WORSEN OR NEW PROBLEM DEVELOPS   Please note: You were cared for by a hospitalist during your hospital stay. Every effort will be made to forward records to your primary care provider.  You can request that your primary care provider send for your hospital records if they have not received them.  Once you are discharged, your primary care physician will handle any further medical issues. Please note that NO REFILLS for any discharge medications will be authorized once you are discharged, as it is imperative that you return to your primary care physician (or establish a relationship with a primary care physician if you do not have one) for your post hospital discharge needs so that they can reassess your need for medications and monitor your lab values.  Please get a complete blood count and chemistry panel checked by your Primary MD at your next visit, and again as instructed by your Primary MD.  Get Medicines reviewed and adjusted: Please take all your medications with you for your next visit with your Primary MD  Laboratory/radiological data: Please request your Primary MD to go over all hospital tests and procedure/radiological results at the follow up, please ask your primary care provider to get all Hospital records sent to his/her office.  In some cases, they will be blood work, cultures and biopsy results pending at the time of your discharge. Please request that your primary care provider follow up on these results.  If you are diabetic, please bring your blood sugar readings with you to your follow up appointment with primary care.    Please call and make your follow up appointments as soon as possible.    Also Note the  following: If you experience worsening of your admission symptoms, develop shortness of breath, life threatening emergency, suicidal or homicidal thoughts you must seek medical attention immediately by calling 911 or calling your MD immediately  if symptoms less severe.  You must read complete instructions/literature along with all the possible adverse reactions/side effects for all the Medicines you take and that have been prescribed to you. Take any new Medicines after you have completely understood and accpet all the possible adverse reactions/side effects.   Do not drive when taking Pain medications or sleeping medications (Benzodiazepines)  Do not take more than prescribed Pain, Sleep and Anxiety Medications. It is not advisable to combine anxiety,sleep and pain medications without talking with your primary care practitioner  Special Instructions: If you have smoked or chewed Tobacco  in the last 2 yrs please stop smoking, stop any regular Alcohol  and or any Recreational drug use.  Wear Seat belts while driving.  Do not drive if taking any narcotic, mind altering or controlled substances or recreational drugs or alcohol.

## 2022-11-07 NOTE — Progress Notes (Signed)
PIV removed tolerated well.

## 2022-11-07 NOTE — Plan of Care (Signed)
  Problem: Health Behavior/Discharge Planning: Goal: Ability to manage health-related needs will improve Outcome: Progressing   Problem: Skin Integrity: Goal: Risk for impaired skin integrity will decrease Outcome: Progressing   Problem: Health Behavior/Discharge Planning: Goal: Ability to manage health-related needs will improve Outcome: Progressing   Problem: Pain Managment: Goal: General experience of comfort will improve Outcome: Progressing

## 2022-11-07 NOTE — Assessment & Plan Note (Signed)
-   Stung by bee - Has been allergic since childhood - No EpiPen at home - Came into the ED hypotensive, diaphoretic, with chest discomfort and shortness of breath - Was given epi, fluids, Benadryl, Solu-Medrol - Immediately felt better and no recurrence of symptoms aside from swollen foot where she got stung - Consult TOC for medication assistance as patient will need EpiPen at home - Will likely need to be discharged with steroids - For now continuing Solu-Medrol, Benadryl, Pepcid

## 2022-11-07 NOTE — Assessment & Plan Note (Signed)
-   Smokes half a pack per day - Counseled on importance of cessation - Declines nicotine patch at this time

## 2022-11-07 NOTE — Discharge Summary (Signed)
Physician Discharge Summary  Felicia Livingston ZDG:[id number] DOB: Apr 14, 1975 DOA: 11/06/2022   Admit date: 11/06/2022 Discharge date: 11/07/2022  Admitted From:  Home  Disposition: Home   Recommendations for Outpatient Follow-up:  Establish care to Follow up with PCP in 1-2 weeks Please stop all tobacco use Please monitor blood pressure and adjust treatments as need Please follow up screening testing for hyperglycemia to rule out DM (A1c is pending at DC) Please follow up on the following pending results: A1c   Home Health: n/a   Discharge Condition: STABLE   CODE STATUS: FULL DIET: low sodium carb modified    Brief Hospitalization Summary: Please see all hospital notes, images, labs for full details of the hospitalization. ADMISSION PROVIDER HPI:  47 y.o. female with medical history significant of bee sting allergy and tobacco use disorder, presents to the ED with a chief complaint of allergic reaction.  Patient reports that she got stung on the bottom of her right foot.  As it always does she felt like she was "having a heart attack."  She is never actually had a heart attack.  She reports the sting happened at 3 PM.  Immediately she had diaphoresis, chest pain, shortness of breath.  She has no EpiPen at home due to financial barriers.  After getting treatment in the ED patient felt better right away.  She has had no recurrence of her symptoms per her report.  She has been drowsy due to the doses of Benadryl.  She reports that she is having waxing and waning pain in her foot.  It is a stabbing and pressure-like pain.  It is very different than when she has been stung before.  Has not hurt this much when she has been stung before per her report.  Her foot is still swollen.  The pain is better when she gets Benadryl.  Patient has no other complaints at this time.  _____________________________________________________  Hospital Course  Pt was placed in observation after having an anaphylactic  reaction to a bee sting which she has had in the past.  She does not currently have an EpiPen and is uninsured.  She was treated with epinephrine, Solu-Medrol Benadryl and Pepcid.  She had a good response to these treatments.  As she seems to be back to her baseline now.  The edema in her foot has markedly improved and she is breathing well eating normally.  She has no further chest pain symptoms.  Patient denies shortness of breath.  Patient was noted to have hyperglycemia possibly secondary to steroid use.  We did order an A1c to screen for diabetes mellitus however the results are still pending at this time.  Patient advised to establish care with a PCP and follow-up with her screening for diabetes.  She is also noted to have elevated blood pressures and we have prescribed atenolol 25 mg once daily and close follow-up with the PCP.  I have consulted TOC to assist patient with obtaining an EpiPen autoinjector and other medications and also to assist her with obtaining a PCP for follow-up.  Patient feels back to her baseline now and is stable to discharge home.  Patient advised to seek medical care or return if she has recurrence of any symptoms.  She verbalized understanding.  Patient also was strongly advised to stop all tobacco use.   Discharge Diagnoses:  Principal Problem:   Chest pain Active Problems:   Allergic reaction   Elevated troponin   Tobacco use disorder  Hypokalemia   Hyperglycemia   Discharge Instructions:  Allergies as of 11/07/2022       Reactions   Bee Venom Anaphylaxis   Lidocaine Hives   Sulfa Antibiotics Hives   Iohexol Hives   Pt. Had CT scan at [facility] and had hives after scan.           Medication List     STOP taking these medications    ibuprofen 200 MG tablet Commonly known as: ADVIL       TAKE these medications    atenolol 25 MG tablet Commonly known as: Tenormin Take 1 tablet (25 mg total) by mouth daily.   EPINEPHrine 0.3 mg/0.3  mL Soaj injection Commonly known as: EpiPen 2-Pak Inject 0.3 mg into the muscle as needed for anaphylaxis.   famotidine 20 MG tablet Commonly known as: PEPCID Take 1 tablet (20 mg total) by mouth at bedtime for 7 days.   loratadine 10 MG tablet Commonly known as: Claritin Take 1 tablet (10 mg total) by mouth daily for 7 days.   multivitamin tablet Take 1 tablet by mouth daily.        Follow-up Information     Primary Care Provider. Schedule an appointment as soon as possible for a visit in 1 week(s).   Why: Hospital Follow Up               Allergies  Allergen Reactions   Bee Venom Anaphylaxis   Lidocaine Hives   Sulfa Antibiotics Hives   Iohexol Hives    Pt. Had [facility] scan at [facility] and had hives after scan.      Allergies as of 11/07/2022       Reactions   Bee Venom Anaphylaxis   Lidocaine Hives   Sulfa Antibiotics Hives   Iohexol Hives   Pt. Had CT scan at [facility] and had hives after scan.           Medication List     STOP taking these medications    ibuprofen 200 MG tablet Commonly known as: ADVIL       TAKE these medications    atenolol 25 MG tablet Commonly known as: Tenormin Take 1 tablet (25 mg total) by mouth daily.   EPINEPHrine 0.3 mg/0.3 mL Soaj injection Commonly known as: EpiPen 2-Pak Inject 0.3 mg into the muscle as needed for anaphylaxis.   famotidine 20 MG tablet Commonly known as: PEPCID Take 1 tablet (20 mg total) by mouth at bedtime for 7 days.   loratadine 10 MG tablet Commonly known as: Claritin Take 1 tablet (10 mg total) by mouth daily for 7 days.   multivitamin tablet Take 1 tablet by mouth daily.        Procedures/Studies: DG Chest Port 1 View  Result Date: 11/06/2022 CLINICAL DATA:  Shortness of breath and diaphoresis, the patient was stung by a bee and states she has an allergy. EXAM: PORTABLE CHEST 1 VIEW COMPARISON:  11/21/2020 FINDINGS: The patient is rotated to the right on  today's radiograph, reducing diagnostic sensitivity and specificity. The lungs appear clear. Cardiac and mediastinal contours normal. No blunting of the costophrenic angles. No significant osseous abnormality. IMPRESSION: 1. No acute findings. Electronically Signed   By: Gaylyn Rong M.D.   On: 11/06/2022 18:31     Subjective: Pt says she feels much better and back to normal and wants to discharge.  She says she will try to stop smoking.    Discharge Exam: Vitals:  11/07/22 0529 11/07/22 1018  BP: (!) 178/102 (!) 195/103  Pulse: (!) 106 100  Resp:  20  Temp: 98.3 F (36.8 C) 98.4 F (36.9 C)  SpO2: 100%    Vitals:   11/06/22 2245 11/07/22 0017 11/07/22 0529 11/07/22 1018  BP: (!) 171/92 (!) 178/94 (!) 178/102 (!) 195/103  Pulse: (!) 101 (!) 109 (!) 106 100  Resp: 15 20  20   Temp:  99 F (37.2 C) 98.3 F (36.8 C) 98.4 F (36.9 C)  TempSrc:   Oral Oral  SpO2: 91% 98% 100%   Weight:      Height:        General: Pt is alert, awake, not in acute distress Cardiovascular: RRR, S1/S2 +, no rubs, no gallops Respiratory: CTA bilaterally, no wheezing, no rhonchi Abdominal: Soft, NT, ND, bowel sounds + Extremities: mild right foot edema, no cyanosis   The results of significant diagnostics from this hospitalization (including imaging, microbiology, ancillary and laboratory) are listed below for reference.     Microbiology: No results found for this or any previous visit (from the past 240 hour(s)).   Labs: BNP (last 3 results) No results for input(s): "BNP" in the last 8760 hours. Basic Metabolic Panel: Recent Labs  Lab 11/06/22 1606 11/07/22 0527  NA 134* 134*  K 3.3* 4.1  CL 100 101  CO2 22 23  GLUCOSE 166* 248*  BUN 12 11  CREATININE 0.89 0.62  CALCIUM 9.1 9.1  MG  --  1.7   Liver Function Tests: Recent Labs  Lab 11/06/22 1606 11/07/22 0527  AST 27 21  ALT 29 30  ALKPHOS 108 96  BILITOT 0.5 0.2*  PROT 7.8 7.1  ALBUMIN 4.0 3.6   No results for  input(s): "LIPASE", "AMYLASE" in the last 168 hours. No results for input(s): "AMMONIA" in the last 168 hours. CBC: Recent Labs  Lab 11/06/22 1606 11/07/22 0527  WBC 9.0 9.4  NEUTROABS 4.5 8.4*  HGB 13.8 11.5*  HCT 44.4 35.8*  MCV 85.1 84.2  PLT 397 249   Cardiac Enzymes: No results for input(s): "CKTOTAL", "CKMB", "CKMBINDEX", "TROPONINI" in the last 168 hours. BNP: Invalid input(s): "POCBNP" CBG: Recent Labs  Lab 11/06/22 2107 11/07/22 0726  GLUCAP 247* 217*   D-Dimer No results for input(s): "DDIMER" in the last 72 hours. Hgb A1c No results for input(s): "HGBA1C" in the last 72 hours. Lipid Profile No results for input(s): "CHOL", "HDL", "LDLCALC", "TRIG", "CHOLHDL", "LDLDIRECT" in the last 72 hours. Thyroid function studies No results for input(s): "TSH", "T4TOTAL", "T3FREE", "THYROIDAB" in the last 72 hours.  Invalid input(s): "FREET3" Anemia work up No results for input(s): "VITAMINB12", "FOLATE", "FERRITIN", "TIBC", "IRON", "RETICCTPCT" in the last 72 hours. Urinalysis    Component Value Date/Time   COLORURINE STRAW (A) 11/06/2022 2305   APPEARANCEUR CLEAR 11/06/2022 2305   LABSPEC 1.012 11/06/2022 2305   PHURINE 6.0 11/06/2022 2305   GLUCOSEU >=500 (A) 11/06/2022 2305   HGBUR MODERATE (A) 11/06/2022 2305   BILIRUBINUR NEGATIVE 11/06/2022 2305   BILIRUBINUR negative 03/07/2019 1533   KETONESUR 5 (A) 11/06/2022 2305   PROTEINUR NEGATIVE 11/06/2022 2305   UROBILINOGEN 2.0 (A) 03/07/2019 1533   NITRITE NEGATIVE 11/06/2022 2305   LEUKOCYTESUR NEGATIVE 11/06/2022 2305   Sepsis Labs Recent Labs  Lab 11/06/22 1606 11/07/22 0527  WBC 9.0 9.4   Microbiology No results found for this or any previous visit (from the past 240 hour(s)).  Time coordinating discharge:   SIGNED:  Standley Dakins, MD  [facility] 11/07/2022, 10:25 AM How to contact the [facility] Attending or Consulting provider 7A - 7P or covering provider during after hours 7P -7A, for  this patient?  Check the care team in [facility] and look for a) attending/consulting TRH provider listed and b) the [facility] team listed Log into [email or web address] and use [place]'s universal password to access. If you do not have the password, please contact the hospital operator. Locate the [facility] provider you are looking for under [facility] and page to a number that you can be directly reached. If you still have difficulty reaching the provider, please page the [facility] (Director on Call) for the Hospitalists listed on amion for assistance.

## 2022-11-07 NOTE — TOC Initial Note (Signed)
Transition of Care [facility]) - Initial/Assessment Note    Patient Details  Name: Felicia Livingston MRN: [id number] Date of Birth: 02-07-1976  Transition of [facility]) CM/SW Contact:    Elliot Gault, LCSW Phone Number: 11/07/2022, 10:36 AM  Clinical Narrative:                  Pt admitted from home. Received TOC consult for assistance with medications and PCP as pt does not have insurance.  Spoke with pt to assess. She confirms that she is uninsured at this time. Discussed referral for [facility] or [facility]. Pt requested [facility]. Referral made and pt scheduled for 01/07/23 appointment. They will call pt if they have a cancellation and can get her in sooner. Info added to AVS.  MATCH voucher provided to pt for assistance with medications at dc.  No other TOC needs at this time.   Expected Discharge Plan: Home/Self Care Barriers to Discharge: Barriers Resolved   Patient Goals and CMS Choice Patient states their goals for this hospitalization and ongoing recovery are:: get better CMS Medicare.gov Compare Post Acute Care list provided to:: Patient        Expected Discharge Plan and Services In-house Referral: Clinical Social Work Discharge Planning Services: Indigent Health Clinic, [facility]   Living arrangements for the past 2 months: Single Family Home Expected Discharge Date: 11/07/22                                    Prior Living Arrangements/Services Living arrangements for the past 2 months: Single Family Home Lives with:: Self Patient language and need for interpreter reviewed:: Yes Do you feel safe going back to the place where you live?: Yes      Need for Family Participation in Patient Care: No (Comment)     Criminal Activity/Legal Involvement Pertinent to Current Situation/Hospitalization: No - Comment as needed  Activities of Daily Living Home Assistive Devices/Equipment: None ADL Screening (condition at time of  admission) Patient's cognitive ability adequate to safely complete daily activities?: Yes Is the patient deaf or have difficulty hearing?: No Does the patient have difficulty seeing, even when wearing glasses/contacts?: No Does the patient have difficulty concentrating, remembering, or making decisions?: No Patient able to express need for assistance with ADLs?: Yes Does the patient have difficulty dressing or bathing?: No Independently performs ADLs?: Yes (appropriate for developmental age) Does the patient have difficulty walking or climbing stairs?: Yes Weakness of Legs: None Weakness of Arms/Hands: None  Permission Sought/Granted Permission sought to share information with : Facility Industrial/product designer granted to share information with : Yes, Verbal Permission Granted     Permission granted to share info w AGENCY: [facility]        Emotional Assessment   Attitude/Demeanor/Rapport: Engaged Affect (typically observed): Accepting Orientation: : Oriented to Self, Oriented to Place, Oriented to  Time, Oriented to Situation Alcohol / Substance Use: Not Applicable Psych Involvement: No (comment)  Admission diagnosis:  Chest pain syndrome [R07.9] Chest pain [R07.9] Allergic reaction, initial encounter [T78.40XA] Patient Active Problem List   Diagnosis Date Noted   Allergic reaction 11/07/2022   Elevated troponin 11/07/2022   Tobacco use disorder 11/07/2022   Hypokalemia 11/07/2022   Hyperglycemia 11/07/2022   Chest pain 11/06/2022   Pyelonephritis 11/21/2020   Acute lower UTI 11/21/2020   COVID-19 virus infection 11/21/2020   PCP:  Pcp, No Pharmacy:  [facility] [place], [place] - [address] [place] [address] Phone: [phone] Fax: [phone]     Social Determinants of Health (SDOH) Social History: SDOH Screenings   Food Insecurity: No Food Insecurity (11/06/2022)  Housing: Low Risk  (11/06/2022)   Transportation Needs: No Transportation Needs (11/07/2022)  Utilities: Not At Risk (11/06/2022)  Tobacco Use: High Risk (11/06/2022)   SDOH Interventions:     Readmission Risk Interventions     No data to display

## 2022-11-07 NOTE — Progress Notes (Signed)
Report given to Oliver Barre, RN accepting care of patient assignment. Notified of patient concern regarding IV site beeping earlier, site flushes well, no swelling, denies pain or discomfort. Requests no additional IV attempts unless necessary, understands to notify nursing if any pain, swelling or further beeping.

## 2022-11-07 NOTE — H&P (Signed)
History and Physical    Patient: Felicia Livingston:[id number] DOB: Oct 09, 1975 DOA: 11/06/2022 DOS: the patient was seen and examined on 11/07/2022 PCP: Pcp, No  Patient coming from: Home  Chief Complaint:  Chief Complaint  Patient presents with   Allergic Reaction   HPI: Felicia Livingston is a 47 y.o. female with medical history significant of bee sting allergy and tobacco use disorder, presents to the ED with a chief complaint of allergic reaction.  Patient reports that she got stung on the bottom of her right foot.  As it always does she felt like she was "having a heart attack."  She is never actually had a heart attack.  She reports the sting happened at 3 PM.  Immediately she had diaphoresis, chest pain, shortness of breath.  She has no EpiPen at home due to financial barriers.  After getting treatment in the ED patient felt better right away.  She has had no recurrence of her symptoms per her report.  She has been drowsy due to the doses of Benadryl.  She reports that she is having waxing and waning pain in her foot.  It is a stabbing and pressure-like pain.  It is very different than when she has been stung before.  Has not hurt this much when she has been stung before per her report.  Her foot is still swollen.  The pain is better when she gets Benadryl.  Patient has no other complaints at this time. Review of Systems: As mentioned in the history of present illness. All other systems reviewed and are negative. History reviewed. No pertinent past medical history. Past Surgical History:  Procedure Laterality Date   APPENDECTOMY     Social History:  reports that she has been smoking. She has never used smokeless tobacco. She reports that she does not drink alcohol and does not use drugs.  Allergies  Allergen Reactions   Bee Venom Anaphylaxis   Lidocaine Hives   Sulfa Antibiotics Hives   Iohexol Hives    Pt. Had CT scan at [facility] and had hives after scan.       Family  History  Family history unknown: Yes    Prior to Admission medications   Medication Sig Start Date End Date Taking? Authorizing Provider  ibuprofen (ADVIL,MOTRIN) 200 MG tablet Take 400 mg by mouth every 6 (six) hours as needed for mild pain.   Yes [provider]  Multiple Vitamin (MULTIVITAMIN) tablet Take 1 tablet by mouth daily.   Yes [provider]    Physical Exam: Vitals:   11/06/22 2228 11/06/22 2245 11/07/22 0017 11/07/22 0529  BP: (!) 165/104 (!) 171/92 (!) 178/94 (!) 178/102  Pulse: (!) 106 (!) 101 (!) 109 (!) 106  Resp: 16 15 20    Temp: 98.3 F (36.8 C)  99 F (37.2 C) 98.3 F (36.8 C)  TempSrc: Oral   Oral  SpO2: 97% 91% 98% 100%  Weight:      Height:       1.  General: Patient lying supine in bed,  no acute distress   2. Psychiatric: Alert and oriented x 3, mood and behavior normal for situation, pleasant and cooperative with exam   3. Neurologic: Speech and language are normal, face is symmetric, moves all 4 extremities voluntarily, at baseline without acute deficits on limited exam   4. HEENMT:  Head is atraumatic, normocephalic, pupils reactive to light, neck is supple, trachea is midline, mucous membranes are moist  5. Respiratory : Lungs are clear to auscultation bilaterally without wheezing, rhonchi, rales, no cyanosis, no increase in work of breathing or accessory muscle use   6. Cardiovascular : Heart rate tachycardic, rhythm is regular, no murmurs, rubs or gallops, no peripheral edema, peripheral pulses palpated   7. Gastrointestinal:  Abdomen is soft, nondistended, nontender to palpation bowel sounds active, no masses or organomegaly palpated   8. Skin:  Skin is warm, dry and intact without rashes, acute lesions, or ulcers on limited exam   9.Musculoskeletal:  Right foot is edematous and erythematous as compared to left  Data Reviewed: In the ED - Temp 98.4-99.1, heart rate 100-135, respiratory rate 18-25, blood  pressure 94/63-166/98, satting 92-99% Labs are unremarkable aside from hyperglycemia hypokalemia and a slightly elevated troponin Chest x-ray is without acute changes EKG shows a heart rate of 100, sinus tach, QTc 4 and 65 Patient was given standard treatment for allergic reaction and admission was requested for allergic reaction  Assessment and Plan: * Chest pain - See plan for elevated troponin - Likely related to allergic reaction and not to cardiac chest pain  Hyperglycemia - Initial glucose on chemistry 166 - This a.m. glucose is 248 which is actually diagnostic of diabetes mellitus type 2 - Hemoglobin A1c pending - Sliding scale coverage - Patient will need established with outpatient PCP - No previous hypoglycemic medications  Hypokalemia - Potassium 3.3 - Replace with 40 mEq of potassium - Trend in the a.m.  Tobacco use disorder - Smokes half a pack per day - Counseled on importance of cessation - Declines nicotine patch at this time  Elevated troponin - Troponin 5 and then 39 - Trending back down - Likely demand ischemia in the setting of hypotension after allergic reaction borderline anaphylactic shock - Blood pressures improved after 2 L of fluid - Patient did have chest tightness during her allergic reaction, but it has been improved ever since she was treated in the ED without recurrence - Monitoring on telemetry  Allergic reaction - Stung by bee - Has been allergic since childhood - No EpiPen at home - Came into the ED hypotensive, diaphoretic, with chest discomfort and shortness of breath - Was given epi, fluids, Benadryl, Solu-Medrol - Immediately felt better and no recurrence of symptoms aside from swollen foot where she got stung - Consult TOC for medication assistance as patient will need EpiPen at home - Will likely need to be discharged with steroids - For now continuing Solu-Medrol, Benadryl, Pepcid      Advance Care Planning:   Code Status:  Full Code  Consults: None at this time  Family Communication: Best friend's boyfriend at bedside  Severity of Illness: The appropriate patient status for this patient is OBSERVATION. Observation status is judged to be reasonable and necessary in order to provide the required intensity of service to ensure the patient's safety. The patient's presenting symptoms, physical exam findings, and initial radiographic and laboratory data in the context of their medical condition is felt to place them at decreased risk for further clinical deterioration. Furthermore, it is anticipated that the patient will be medically stable for discharge from the hospital within 2 midnights of admission.   Author: Lilyan Gilford, DO 11/07/2022 6:55 AM  For on call review [email or web address].

## 2022-11-07 NOTE — Assessment & Plan Note (Signed)
-   See plan for elevated troponin - Likely related to allergic reaction and not to cardiac chest pain

## 2022-11-07 NOTE — Assessment & Plan Note (Signed)
-   Initial glucose on chemistry 166 - This a.m. glucose is 248 which is actually diagnostic of diabetes mellitus type 2 - Hemoglobin A1c pending - Sliding scale coverage - Patient will need established with outpatient PCP - No previous hypoglycemic medications

## 2022-11-07 NOTE — TOC CM/SW Note (Addendum)
'  MATCH MEDICATION ASSISTANCE CARD Pharmacies please call [phone] for claim processing assistance.  Rx BIN: [id number] Rx Group: [id number] Rx PCN: [id number] Relationship Code: 1 Person Code: 01  Patient ID (MRN): [id number]    Patient Name: Felicia Livingston   Patient DOB: 29-Dec-1975   Discharge Date: 11/07/2022  Expiration Date: 11/14/2022 (must be filled within 7 days of discharge)   Dear [name], You have been approved to have the prescriptions written by your discharging physician filled through our [facility] (Medication Assistance Through [facility]) program. This program allows for a one-time (no refills) 34-day supply of selected medications for a low copay amount.  The copay is $3.00 per prescription. For instance, if you have one prescription, you will pay $3.00; for two prescriptions, you pay $6.00; for three prescriptions, you pay $9.00; and so on. Only certain pharmacies are participating in this program with [facility]. You will need to select one of the pharmacies from the attached lists and take your prescriptions, this letter, and your photo ID to one of the participating pharmacies.  We are excited that you are able to use the [facility] program to get your medications. These prescriptions must be filled within 7 days of hospital discharge or they will no longer be valid for the [facility] program. Should you have any problems with your prescriptions please contact your case management team member at [phone] for [name] or [phone] for [facility].  Thank you, [facility]

## 2022-11-07 NOTE — Progress Notes (Signed)
Educated provided to patient on Epi-pen, benadryl, solumedrol, and pepcid for treatment of bee sting allergic reaction. Education provided for patient to cal 91 wil future bee sting for immediate treatment. Notified patient [facility] has the current cheapest self pay Epi-pen for $114 through [facility]. Advised patient to carry benadryl and pepcid at home. Educated patient to use ice on foot to reduce pain and swelling. Discussed elevated blood sugar and need to establish care with PCP for new onset diabetes treatment. Advised patient to increase fiber intake and avoid high sugary food such as soda, pt states she like Mt Dew. Oliver Barre, RN

## 2022-11-07 NOTE — Assessment & Plan Note (Addendum)
-   Potassium 3.3 - Replace with 40 mEq of potassium - Trend in the a.m.

## 2022-11-08 LAB — HEMOGLOBIN A1C
Hgb A1c MFr Bld: 6.6 % — ABNORMAL HIGH (ref 4.8–5.6)
Mean Plasma Glucose: 143 mg/dL
